# Patient Record
Sex: Female | Born: 1965 | Race: White | Hispanic: No | Marital: Married | Smoking: Never smoker
Health system: Southern US, Community
[De-identification: ages and names within clinical notes are randomized; demographics above are authoritative.]

## PROBLEM LIST (undated history)

## (undated) DIAGNOSIS — N979 Female infertility, unspecified: Secondary | ICD-10-CM

## (undated) DIAGNOSIS — F32A Depression, unspecified: Secondary | ICD-10-CM

## (undated) DIAGNOSIS — C4492 Squamous cell carcinoma of skin, unspecified: Secondary | ICD-10-CM

## (undated) DIAGNOSIS — B977 Papillomavirus as the cause of diseases classified elsewhere: Secondary | ICD-10-CM

## (undated) DIAGNOSIS — F329 Major depressive disorder, single episode, unspecified: Secondary | ICD-10-CM

## (undated) DIAGNOSIS — G43909 Migraine, unspecified, not intractable, without status migrainosus: Secondary | ICD-10-CM

## (undated) DIAGNOSIS — F418 Other specified anxiety disorders: Secondary | ICD-10-CM

## (undated) HISTORY — DX: Migraine, unspecified, not intractable, without status migrainosus: G43.909

## (undated) HISTORY — PX: AUGMENTATION MAMMAPLASTY: SUR837

## (undated) HISTORY — DX: Female infertility, unspecified: N97.9

## (undated) HISTORY — PX: FINGER SURGERY: SHX640

## (undated) HISTORY — DX: Other specified anxiety disorders: F41.8

## (undated) HISTORY — DX: Papillomavirus as the cause of diseases classified elsewhere: B97.7

## (undated) HISTORY — DX: Depression, unspecified: F32.A

## (undated) HISTORY — DX: Major depressive disorder, single episode, unspecified: F32.9

---
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
## 2002-10-05 ENCOUNTER — Other Ambulatory Visit: Admission: RE | Admit: 2002-10-05 | Discharge: 2002-10-05 | Payer: Self-pay | Admitting: Obstetrics and Gynecology

## 2002-12-30 ENCOUNTER — Other Ambulatory Visit: Admission: RE | Admit: 2002-12-30 | Discharge: 2002-12-30 | Payer: Self-pay | Admitting: Obstetrics and Gynecology

## 2003-12-23 ENCOUNTER — Other Ambulatory Visit: Admission: RE | Admit: 2003-12-23 | Discharge: 2003-12-23 | Payer: Self-pay | Admitting: Obstetrics and Gynecology

## 2004-12-25 ENCOUNTER — Other Ambulatory Visit: Admission: RE | Admit: 2004-12-25 | Discharge: 2004-12-25 | Payer: Self-pay | Admitting: Obstetrics and Gynecology

## 2005-09-04 ENCOUNTER — Encounter: Admission: RE | Admit: 2005-09-04 | Discharge: 2005-09-04 | Payer: Self-pay | Admitting: Obstetrics and Gynecology

## 2005-11-25 DIAGNOSIS — C4492 Squamous cell carcinoma of skin, unspecified: Secondary | ICD-10-CM

## 2005-11-25 HISTORY — DX: Squamous cell carcinoma of skin, unspecified: C44.92

## 2005-12-26 ENCOUNTER — Other Ambulatory Visit: Admission: RE | Admit: 2005-12-26 | Discharge: 2005-12-26 | Payer: Self-pay | Admitting: Obstetrics & Gynecology

## 2006-12-30 ENCOUNTER — Other Ambulatory Visit: Admission: RE | Admit: 2006-12-30 | Discharge: 2006-12-30 | Payer: Self-pay | Admitting: Obstetrics & Gynecology

## 2007-01-24 HISTORY — PX: BREAST ENHANCEMENT SURGERY: SHX7

## 2007-01-26 ENCOUNTER — Encounter: Admission: RE | Admit: 2007-01-26 | Discharge: 2007-01-26 | Payer: Self-pay | Admitting: Obstetrics & Gynecology

## 2008-01-18 ENCOUNTER — Other Ambulatory Visit: Admission: RE | Admit: 2008-01-18 | Discharge: 2008-01-18 | Payer: Self-pay | Admitting: Obstetrics & Gynecology

## 2008-02-24 ENCOUNTER — Encounter: Admission: RE | Admit: 2008-02-24 | Discharge: 2008-02-24 | Payer: Self-pay | Admitting: Obstetrics & Gynecology

## 2008-03-25 HISTORY — PX: BREAST ENHANCEMENT SURGERY: SHX7

## 2009-01-20 ENCOUNTER — Other Ambulatory Visit: Admission: RE | Admit: 2009-01-20 | Discharge: 2009-01-20 | Payer: Self-pay | Admitting: Obstetrics and Gynecology

## 2009-04-07 ENCOUNTER — Encounter: Admission: RE | Admit: 2009-04-07 | Discharge: 2009-04-07 | Payer: Self-pay | Admitting: Obstetrics and Gynecology

## 2010-04-13 ENCOUNTER — Encounter: Admission: RE | Admit: 2010-04-13 | Discharge: 2010-04-13 | Payer: Self-pay | Admitting: Obstetrics and Gynecology

## 2011-01-31 ENCOUNTER — Institutional Professional Consult (permissible substitution) (INDEPENDENT_AMBULATORY_CARE_PROVIDER_SITE_OTHER): Payer: BC Managed Care – PPO | Admitting: Family Medicine

## 2011-01-31 DIAGNOSIS — R142 Eructation: Secondary | ICD-10-CM

## 2011-01-31 DIAGNOSIS — R141 Gas pain: Secondary | ICD-10-CM

## 2011-01-31 DIAGNOSIS — R109 Unspecified abdominal pain: Secondary | ICD-10-CM

## 2011-03-26 ENCOUNTER — Other Ambulatory Visit: Payer: Self-pay | Admitting: Obstetrics and Gynecology

## 2011-03-26 DIAGNOSIS — Z1231 Encounter for screening mammogram for malignant neoplasm of breast: Secondary | ICD-10-CM

## 2011-04-16 ENCOUNTER — Ambulatory Visit
Admission: RE | Admit: 2011-04-16 | Discharge: 2011-04-16 | Disposition: A | Discharge status: Home / Self Care | Payer: BC Managed Care – PPO | Source: Ambulatory Visit | Attending: Obstetrics and Gynecology | Admitting: Obstetrics and Gynecology

## 2011-04-16 DIAGNOSIS — Z1231 Encounter for screening mammogram for malignant neoplasm of breast: Secondary | ICD-10-CM

## 2011-07-22 ENCOUNTER — Encounter: Payer: Self-pay | Admitting: Family Medicine

## 2012-03-17 LAB — HM PAP SMEAR: HM Pap smear: NEGATIVE

## 2012-05-04 ENCOUNTER — Other Ambulatory Visit: Payer: Self-pay | Admitting: Obstetrics and Gynecology

## 2012-05-04 DIAGNOSIS — Z1231 Encounter for screening mammogram for malignant neoplasm of breast: Secondary | ICD-10-CM

## 2012-05-05 ENCOUNTER — Ambulatory Visit
Admission: RE | Admit: 2012-05-05 | Discharge: 2012-05-05 | Disposition: A | Discharge status: Home / Self Care | Payer: BC Managed Care – PPO | Source: Ambulatory Visit | Attending: Obstetrics and Gynecology | Admitting: Obstetrics and Gynecology

## 2012-05-05 DIAGNOSIS — Z1231 Encounter for screening mammogram for malignant neoplasm of breast: Secondary | ICD-10-CM

## 2012-08-12 ENCOUNTER — Other Ambulatory Visit (INDEPENDENT_AMBULATORY_CARE_PROVIDER_SITE_OTHER): Payer: BC Managed Care – PPO

## 2012-08-12 DIAGNOSIS — Z23 Encounter for immunization: Secondary | ICD-10-CM

## 2012-11-13 ENCOUNTER — Telehealth: Payer: Self-pay | Admitting: Family Medicine

## 2012-11-13 ENCOUNTER — Other Ambulatory Visit (INDEPENDENT_AMBULATORY_CARE_PROVIDER_SITE_OTHER): Payer: BC Managed Care – PPO

## 2012-11-13 DIAGNOSIS — W5501XA Bitten by cat, initial encounter: Secondary | ICD-10-CM

## 2012-11-13 DIAGNOSIS — Z23 Encounter for immunization: Secondary | ICD-10-CM

## 2012-11-13 MED ORDER — AMOXICILLIN-POT CLAVULANATE 875-125 MG PO TABS: 1.0000 | ORAL_TABLET | Freq: Two times a day (BID) | ORAL | Status: AC

## 2012-11-13 NOTE — Telephone Encounter (Signed)
She was bitten by a foster cat.  Going out of town tomorrow.  She texted asking if I'd be available over weekend to call in rx if it got infected, and I recommended prophylactic treatment.  Discussed that with bites on hand, sometimes requires surgery if infected, NOT to wait for signs of infection, but start ABX now.  She agrees, and confirms no PCN allergy.  Side effects reviewed, use probiotics if develops diarrhea.  No tetanus shot on record in computer.  Advised pt TdaP is recommended at some point, to call for nurse visit

## 2012-11-13 NOTE — Telephone Encounter (Signed)
FYI

## 2013-03-30 ENCOUNTER — Encounter: Payer: Self-pay | Admitting: Nurse Practitioner

## 2013-03-31 ENCOUNTER — Encounter: Payer: Self-pay | Admitting: Nurse Practitioner

## 2013-03-31 ENCOUNTER — Ambulatory Visit (INDEPENDENT_AMBULATORY_CARE_PROVIDER_SITE_OTHER): Payer: BC Managed Care – PPO | Admitting: Nurse Practitioner

## 2013-03-31 VITALS — BP 96/48 | HR 62 | Resp 14 | Ht 69.0 in | Wt 116.8 lb

## 2013-03-31 DIAGNOSIS — Z0000 Encounter for general adult medical examination without abnormal findings: Secondary | ICD-10-CM

## 2013-03-31 DIAGNOSIS — Z01419 Encounter for gynecological examination (general) (routine) without abnormal findings: Secondary | ICD-10-CM

## 2013-03-31 LAB — POCT URINALYSIS DIPSTICK
Leukocytes, UA: NEGATIVE
Spec Grav, UA: 1.015
Urobilinogen, UA: NEGATIVE
pH, UA: 6.5

## 2013-03-31 LAB — COMPREHENSIVE METABOLIC PANEL
ALT: 13 U/L (ref 0–35)
AST: 17 U/L (ref 0–37)
Albumin: 4.6 g/dL (ref 3.5–5.2)
Alkaline Phosphatase: 32 U/L — ABNORMAL LOW (ref 39–117)
BUN: 14 mg/dL (ref 6–23)
CO2: 23 mEq/L (ref 19–32)
Calcium: 9.3 mg/dL (ref 8.4–10.5)
Chloride: 108 mEq/L (ref 96–112)
Creat: 0.66 mg/dL (ref 0.50–1.10)
Glucose, Bld: 87 mg/dL (ref 70–99)
Sodium: 139 mEq/L (ref 135–145)
Total Bilirubin: 0.7 mg/dL (ref 0.3–1.2)
Total Protein: 6.5 g/dL (ref 6.0–8.3)

## 2013-03-31 LAB — LIPID PANEL
Cholesterol: 139 mg/dL (ref 0–200)
HDL: 72 mg/dL (ref >39)
LDL Cholesterol: 59 mg/dL (ref 0–99)
Triglycerides: 38 mg/dL (ref <150)
VLDL: 8 mg/dL (ref 0–40)

## 2013-03-31 LAB — TSH: TSH: 1.488 u[IU]/mL (ref 0.350–4.500)

## 2013-03-31 MED ORDER — SUMATRIPTAN SUCCINATE 50 MG PO TABS: 50.0000 mg | ORAL_TABLET | ORAL | Status: DC | PRN

## 2013-03-31 NOTE — Patient Instructions (Signed)

## 2013-03-31 NOTE — Progress Notes (Signed)
47 y.o. G80P2002 Married Caucasian Fe here for annual exam.  Menses is regular and then has a migraine the week prior for a few days.  No cramps.  Heavy for only 1- 1/2 day. Super tampon changing 3 x day.  New problems with the children. Daughter having panic attacks at school.  Son having abdominal pain with ? Etiology.  Has [facility] at [facility] and now going to [facility].  He has missed a lot of school.  Patient's last menstrual period was 03/10/2013.          Sexually active: yes  The current method of family planning is vasectomy.    Exercising: yes  yoga and tennis Smoker:  no  Health Maintenance: Pap:  03/17/2012 normal with negative HR HPV MMG:  05/05/12 normal Colonoscopy:  never Td:  10/05/2002 ; TDaP given 11/13/12 Labs: Hgb- 11.9   reports that she has never smoked. She has never used smokeless tobacco. She reports that she drinks about 1.2 ounces of alcohol per week. She reports that she does not use illicit drugs.  Past Medical History  Diagnosis Date  . Migraines   . Neuroma     foot  . HPV in female   . Depression   . Situational anxiety   . Infertility, female     took clomid    Past Surgical History  Procedure Laterality Date  . Cesarean section    . Breast enhancement surgery Bilateral 01/2007    implants  . Neuroma surgery    . Breast enhancement surgery Bilateral 5/09    reconstruction/replacement breast implants  . Cesarean section  3/98    Current Outpatient Prescriptions  Medication Sig Dispense Refill  . acetaminophen (TYLENOL) 325 MG tablet Take 650 mg by mouth every 6 (six) hours as needed.        Marland Kitchen aspirin 81 MG tablet Take 81 mg by mouth daily.      . Biotin 1 MG CAPS Take by mouth daily.      . calcium carbonate (TUMS - DOSED IN MG ELEMENTAL CALCIUM) 500 MG chewable tablet Chew 1 tablet by mouth daily.        . Cholecalciferol (VITAMIN D) 1000 UNITS capsule Take 1,000 Units by mouth daily.      .  Flaxseed, Linseed, (FLAX SEED OIL PO) Take by mouth 2 (two) times daily.      Marland Kitchen lactobacillus acidophilus (BACID) TABS Take 2 tablets by mouth daily.      Marland Kitchen MAGNESIUM PO Take by mouth daily.      . Multiple Vitamins-Minerals (MULTIVITAMIN WITH MINERALS) tablet Take 1 tablet by mouth daily.        . SUMAtriptan (IMITREX) 50 MG tablet Take 50 mg by mouth every 2 (two) hours as needed.        Marland Kitchen b complex vitamins tablet Take 1 tablet by mouth daily.       No current facility-administered medications for this visit.    Family History  Problem Relation Age of Onset  . Breast cancer Maternal Grandmother 69  . Ovarian cancer Maternal Grandmother 91  . Melanoma Paternal Grandfather   . Hypertension Paternal Grandfather   . Heart attack Father     CABG  . Transient ischemic attack Father   . Migraines Mother     ROS:  Pertinent items are noted in HPI.  Otherwise, a comprehensive ROS was negative.  Exam:   BP 96/48  Pulse 62  Resp 14  Ht 5\' 9"  (1.753 m)  Wt 116 lb 12.8 oz (52.98 kg)  BMI 17.24 kg/m2  LMP 03/10/2013 Height: 5\' 9"  (175.3 cm)  Ht Readings from Last 3 Encounters:  03/31/13 5\' 9"  (1.753 m)  01/31/11 5' 8.5" (1.74 m)    General appearance: alert, cooperative and appears stated age, tearful at times Head: Normocephalic, without obvious abnormality, atraumatic Neck: no adenopathy, supple, symmetrical, trachea midline and thyroid normal to inspection and palpation Lungs: clear to auscultation bilaterally Breasts: normal appearance, no masses or tenderness Heart: regular rate and rhythm Abdomen: soft, non-tender; no masses,  no organomegaly Extremities: extremities normal, atraumatic, no cyanosis or edema Skin: Skin color, texture, turgor normal. No rashes or lesions Lymph nodes: Cervical, supraclavicular, and axillary nodes normal. No abnormal inguinal nodes palpated Neurologic: Grossly normal   Pelvic: External genitalia:  no lesions              Urethra:  normal  appearing urethra with no masses, tenderness or lesions              Bartholin's and Skene's: normal                 Vagina: normal appearing vagina with normal color and discharge, no lesions              Cervix: anteverted              Pap taken: no Bimanual Exam:  Uterus:  normal size, contour, position, consistency, mobility, non-tender              Adnexa: no mass, fullness, tenderness               Rectovaginal: Confirms               Anus:  normal sphincter tone, no lesions  A:  Well Woman with normal exam  Regular Menses and husband with vasectomy  P:   Pap smear as per guidelines   Mammogram due 04/2013  Fasting labs follow with results  counseled on adequate intake of calcium and vitamin D, diet and exercise  return annually or prn  An After Visit Summary was printed and given to the patient.

## 2013-04-01 ENCOUNTER — Telehealth: Payer: Self-pay | Admitting: *Deleted

## 2013-04-01 LAB — HEMOGLOBIN, FINGERSTICK: Hemoglobin, fingerstick: 11.9 g/dL — ABNORMAL LOW (ref 12.0–16.0)

## 2013-04-01 NOTE — Progress Notes (Signed)
Encounter reviewed by Dr. Gwen Edler Silva.  

## 2013-04-01 NOTE — Telephone Encounter (Signed)
Pt is aware of all lab results and is agreeable to Iron OTC qd and 600-800 IU of Vitamin D (OTC) qd.

## 2013-04-01 NOTE — Telephone Encounter (Signed)
Message copied by Osie Bond on Thu Apr 01, 2013  1:12 PM ------      Message from: Ria Comment R      Created: Thu Apr 01, 2013 12:37 PM       Let patient know about results, vit D per protocol ------

## 2013-04-06 ENCOUNTER — Other Ambulatory Visit: Payer: Self-pay

## 2013-04-06 DIAGNOSIS — Z1231 Encounter for screening mammogram for malignant neoplasm of breast: Secondary | ICD-10-CM

## 2013-04-08 ENCOUNTER — Telehealth: Payer: Self-pay | Admitting: Nurse Practitioner

## 2013-04-08 NOTE — Telephone Encounter (Signed)
Leavy Cella called to speak specifically with Tiffany, in regards to her lab test results. She also made it clear that she needed prior authorization for sumatriptan prescription with "[employer]." In addition, Ms.Luevano exhibited trepidation in regards to a Vitamin D deficiency that she would also like to discuss in depth. Please return her call.

## 2013-04-22 ENCOUNTER — Telehealth: Payer: Self-pay | Admitting: *Deleted

## 2013-04-22 NOTE — Telephone Encounter (Signed)
PRIOR AUTH FORM FOR [employer] TO FILL OUT FOR MEDICATION. ON YOUR DESK.

## 2013-04-26 NOTE — Telephone Encounter (Signed)
Patty, This prior authorization was given to me but is your pt.  Tresa Endo put the chart on your desk.

## 2013-04-30 ENCOUNTER — Telehealth: Payer: Self-pay | Admitting: Nurse Practitioner

## 2013-04-30 NOTE — Telephone Encounter (Signed)
Patient is having stress concerns. Please call back . Wants to discuss Lexapro. Has taken it before .Would like to see about taking something again for the stress.

## 2013-04-30 NOTE — Telephone Encounter (Signed)
Spoke with pt about family history of anxiety. Pt reports her husband and daughter both take Zoloft and it has dramatically helped them with anxiety. Pt has taken Lexapro in the past prescribed by [facility], but is now considering trying Zoloft. Wondering if [facility] would let her try it to see if it helps. Please advise.

## 2013-04-30 NOTE — Telephone Encounter (Signed)
LMTCB  aa 

## 2013-05-05 ENCOUNTER — Encounter: Payer: Self-pay | Admitting: Nurse Practitioner

## 2013-05-05 ENCOUNTER — Ambulatory Visit (INDEPENDENT_AMBULATORY_CARE_PROVIDER_SITE_OTHER): Payer: BC Managed Care – PPO | Admitting: Nurse Practitioner

## 2013-05-05 VITALS — BP 96/58 | HR 54 | Resp 12 | Ht 69.0 in | Wt 122.2 lb

## 2013-05-05 DIAGNOSIS — F329 Major depressive disorder, single episode, unspecified: Secondary | ICD-10-CM

## 2013-05-05 DIAGNOSIS — F32A Depression, unspecified: Secondary | ICD-10-CM

## 2013-05-05 MED ORDER — SUMATRIPTAN SUCCINATE 50 MG PO TABS: 50.0000 mg | ORAL_TABLET | ORAL | Status: DC | PRN

## 2013-05-05 MED ORDER — SERTRALINE HCL 50 MG PO TABS: 50.0000 mg | ORAL_TABLET | Freq: Every day | ORAL | Status: DC

## 2013-05-05 NOTE — Progress Notes (Signed)
Reviewed personally.  M. Suzanne Angles Trevizo, MD.  

## 2013-05-05 NOTE — Progress Notes (Signed)
Subjective:     Patient ID: Alyssa Reynolds, female   DOB: 1966-08-12, 47 y.o.   MRN: [id number]  HPI46 yo WM Fe presents with history of ongoing depression and anxiety about home environment. Symptoms more noted about Christmas with snappy and angry a lot. Crying spells.  Stressors has included her son with some type of abdominal pain and evaluation by multiple physicians and finally seen at [facility] without a definitive diagnosis.  He is starting a new school this fall and for the most part just wants to argue a lot with her. The daughter had anxiety attacks at school last year and nor feels anxious about anew school.Husband also with some stress in dealing with the family and she has felt like she tries to keep it together for everyone else. She denies suicidal thoughts.  In the remote past took Lexapro and felt she does well with this but would like to try Zoloft since her family has done well on this med's.She does have slight sleep problems and take OTC sleep only rare.   Review of Systems  Constitutional: Negative for activity change, appetite change and fatigue.  Respiratory: Negative.   Cardiovascular: Negative.   Gastrointestinal: Negative.   Neurological: Negative for dizziness, tremors, seizures, syncope, facial asymmetry, weakness and numbness.       History of headches  Psychiatric/Behavioral: Positive for sleep disturbance, dysphoric mood and agitation. Negative for suicidal ideas, hallucinations, confusion and self-injury. The patient is nervous/anxious. The patient is not hyperactive.        Objective:   Physical Exam  Constitutional: She is oriented to person, place, and time. She appears well-developed and well-nourished.  Neurological: She is alert and oriented to person, place, and time.  Psychiatric: Her behavior is normal. Judgment and thought content normal.  Feels sad at times. Today has good eye contact and able to express her concerns.  Denies suicidal thoughts. She is  tearful at times.       Assessment:     Situational depression and anxiety    Plan:     Rx for Zoloft 50 mg sent to the pharmacy # 90 / 0 refills. Recheck in 3 months She will call earlier if any worsening of symptoms

## 2013-05-05 NOTE — Patient Instructions (Signed)

## 2013-05-07 ENCOUNTER — Other Ambulatory Visit: Payer: Self-pay

## 2013-05-07 ENCOUNTER — Ambulatory Visit
Admission: RE | Admit: 2013-05-07 | Discharge: 2013-05-07 | Disposition: A | Discharge status: Home / Self Care | Payer: BC Managed Care – PPO | Source: Ambulatory Visit

## 2013-05-07 DIAGNOSIS — Z1231 Encounter for screening mammogram for malignant neoplasm of breast: Secondary | ICD-10-CM

## 2013-06-18 ENCOUNTER — Telehealth: Payer: Self-pay | Admitting: Nurse Practitioner

## 2013-06-18 NOTE — Telephone Encounter (Signed)
Do you want to increase her dose?

## 2013-06-18 NOTE — Telephone Encounter (Signed)
Sertraline RX: patient has been taking for about a month now but does not see a big change wants to increase dose from 50 mgs. to a higher dosage. [facility] on [address]

## 2013-06-20 NOTE — Telephone Encounter (Signed)
Yes OK to increase dose to 100 mg daily but only for 3 months then needs a consult visit.  If symptoms worsens at all tho call back.

## 2013-06-21 MED ORDER — SERTRALINE HCL 100 MG PO TABS: 100.0000 mg | ORAL_TABLET | Freq: Every day | ORAL | Status: DC

## 2013-06-21 NOTE — Telephone Encounter (Signed)
Left message on [facility] [employer] CB#VM of need to call office concerning medication. Patient Rx e-scribed to [facility] on [address]

## 2013-06-22 NOTE — Telephone Encounter (Signed)
Patient reached on her cell# of information of Rx for Sertraline was increased by Shirlyn Goltz and sent to [facility]. Patient is aware of need to follow up in 3 month with P.Grubb concerning refills.

## 2013-08-05 ENCOUNTER — Encounter: Payer: Self-pay | Admitting: Nurse Practitioner

## 2013-08-05 ENCOUNTER — Ambulatory Visit (INDEPENDENT_AMBULATORY_CARE_PROVIDER_SITE_OTHER): Payer: BC Managed Care – PPO | Admitting: Nurse Practitioner

## 2013-08-05 VITALS — BP 100/56 | HR 80 | Ht 69.0 in | Wt 119.0 lb

## 2013-08-05 DIAGNOSIS — F32A Depression, unspecified: Secondary | ICD-10-CM

## 2013-08-05 DIAGNOSIS — F329 Major depressive disorder, single episode, unspecified: Secondary | ICD-10-CM

## 2013-08-05 MED ORDER — SERTRALINE HCL 100 MG PO TABS: 100.0000 mg | ORAL_TABLET | Freq: Every day | ORAL | Status: DC

## 2013-08-05 MED ORDER — SUMATRIPTAN SUCCINATE 50 MG PO TABS: 50.0000 mg | ORAL_TABLET | ORAL | Status: DC | PRN

## 2013-08-05 NOTE — Patient Instructions (Signed)
If any changes with mood or thouhgts of suicide to call or seek help.

## 2013-08-05 NOTE — Progress Notes (Signed)
Subjective:     Patient ID: Alyssa Reynolds, female   DOB: 25-Feb-1966, 47 y.o.   MRN: [id number]  HPI  47 yo WM Fe returns to discuss her treatment with Zoloft. After she was last here she had an increase in stress to getting a change of school for her son.  Her Zoloft was increased to 100 mg and she comes in now to discuss this past month.  States she has felt much better, less tense, anxious, and no crying episodes.  She attributes a lot of improved overall mental status on the med's abut also recognizes that her children are happier in school.  They are staying out of trouble and seem to be doing better both physically and emotionally.  Daughter had some problems this summer with acting out behaviors but is now on med's for ADHD and that is really helpful.  Son still has some abdominal pain issues but is learning how to adjust better.  Review of Systems  Constitutional: Negative for fever, chills, diaphoresis, fatigue and unexpected weight change.  HENT: Negative.   Respiratory: Negative.   Cardiovascular: Negative.   Gastrointestinal: Negative.   Endocrine: Negative.   Genitourinary: Negative.   Musculoskeletal: Negative.   Skin: Negative.   Hematological: Negative.   Psychiatric/Behavioral: Negative.  Negative for suicidal ideas, behavioral problems, confusion, sleep disturbance, self-injury, dysphoric mood, decreased concentration and agitation. The patient is not nervous/anxious.        Objective:   Physical Exam  Constitutional: She is oriented to person, place, and time. She appears well-developed and well-nourished.  No physical exam is indicated at this time.  Neurological: She is alert and oriented to person, place, and time.  Psychiatric: She has a normal mood and affect. Her behavior is normal. Judgment and thought content normal.       Assessment:     Situational anxiety and depression - improved on Zoloft 100 mg    Plan:     Continue Zoloft at 100 mg daily - will  refill RX through to next AEX.  She is to call back if any symptoms worsens. Also refill is placed for her Imitrex that she uses for migraine headaches.

## 2013-08-11 NOTE — Progress Notes (Signed)
Encounter reviewed by Dr. Brook Silva.  

## 2013-08-13 ENCOUNTER — Telehealth: Payer: Self-pay | Admitting: Emergency Medicine

## 2013-08-13 NOTE — Telephone Encounter (Signed)
Called for Prior Authorization to [phone] Spoke with Aurther Loft B.  Imitrex 50 mg Tablet.  Will fax form [place] for signature [phone]

## 2013-08-17 NOTE — Telephone Encounter (Signed)
Received Prior Authorization form back to office. Per note, no prior authorization required. Patient must wait a full 30 days before refill rx. Last filled 07/16/13. Patient notified and agreeable.

## 2013-09-13 ENCOUNTER — Encounter: Payer: Self-pay | Admitting: Internal Medicine

## 2013-10-05 DIAGNOSIS — M659 Synovitis and tenosynovitis, unspecified: Secondary | ICD-10-CM | POA: Insufficient documentation

## 2013-10-05 DIAGNOSIS — M79673 Pain in unspecified foot: Secondary | ICD-10-CM | POA: Insufficient documentation

## 2013-10-05 DIAGNOSIS — M65979 Unspecified synovitis and tenosynovitis, unspecified ankle and foot: Secondary | ICD-10-CM | POA: Insufficient documentation

## 2013-10-13 ENCOUNTER — Encounter: Payer: Self-pay | Admitting: Family Medicine

## 2013-11-17 ENCOUNTER — Telehealth: Payer: Self-pay | Admitting: Emergency Medicine

## 2013-11-17 NOTE — Telephone Encounter (Signed)
Prior Authorization initiated for Imitrex 50 mg PO 1 tablet po q 2 hours prn #9 5 refills.   Will fax form.

## 2013-11-25 HISTORY — PX: FOOT SURGERY: SHX648

## 2013-12-09 NOTE — Telephone Encounter (Signed)
Spoke with patient and advised that per [employer], rx was approved for 9 tablets per Month.  She will try to obtain rx.

## 2013-12-22 ENCOUNTER — Encounter: Payer: Self-pay | Admitting: Family Medicine

## 2013-12-22 ENCOUNTER — Ambulatory Visit (INDEPENDENT_AMBULATORY_CARE_PROVIDER_SITE_OTHER): Payer: BC Managed Care – PPO | Admitting: Family Medicine

## 2013-12-22 VITALS — BP 100/70 | HR 92 | Temp 98.6°F | Ht 68.0 in | Wt 127.0 lb

## 2013-12-22 DIAGNOSIS — R52 Pain, unspecified: Secondary | ICD-10-CM

## 2013-12-22 DIAGNOSIS — J111 Influenza due to unidentified influenza virus with other respiratory manifestations: Secondary | ICD-10-CM

## 2013-12-22 DIAGNOSIS — J101 Influenza due to other identified influenza virus with other respiratory manifestations: Secondary | ICD-10-CM

## 2013-12-22 DIAGNOSIS — R509 Fever, unspecified: Secondary | ICD-10-CM

## 2013-12-22 LAB — POC INFLUENZA A&B (BINAX/QUICKVUE)
INFLUENZA A, POC: POSITIVE
INFLUENZA B, POC: NEGATIVE

## 2013-12-22 MED ORDER — OSELTAMIVIR PHOSPHATE 75 MG PO CAPS: 75.0000 mg | ORAL_CAPSULE | Freq: Two times a day (BID) | ORAL | Status: DC

## 2013-12-22 NOTE — Patient Instructions (Signed)

## 2013-12-22 NOTE — Progress Notes (Signed)
Chief Complaint  Patient presents with  . Fever    pt states she think she may have the flu, she has been runnning a fever and having chills (Flu A-positive)   Cold symptoms started 2 days ago. Yesterday she started having body aches, chills.  She had temp of 100.4 last night.  No known sick contacts. She has taken Nyquil and Dayquil with some improvement.  Cough has been mild. Ongoing headache, achiness.  Her daughter has a modeling event on Saturday that she wants to be well for.  Past Medical History  Diagnosis Date  . Migraines   . 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 Depression   . Situational anxiety   . Infertility, female     took clomid   Past Surgical History  Procedure Laterality Date  . Cesarean section    . Breast enhancement surgery Bilateral 01/2007    implants  . Neuroma surgery    . Breast enhancement surgery Bilateral 5/09    reconstruction/replacement breast implants  . Cesarean section  3/98   History   Social History  . Marital Status: Married    Spouse Name: N/A    Number of Children: N/A  . Years of Education: N/A   Occupational History  . Not on file.   Social History Main Topics  . Smoking status: Never Smoker   . Smokeless tobacco: Never Used  . Alcohol Use: 1.2 oz/week    2 Glasses of wine per week  . Drug Use: No  . Sexual Activity: Yes    Birth Control/ Protection: None     Comment: husband had vasectomy    Other Topics Concern  . Not on file   Social History Narrative  . No narrative on file   Outpatient Encounter Prescriptions as of 12/22/2013  Medication Sig  . acetaminophen (TYLENOL) 325 MG tablet Take 650 mg by mouth every 6 (six) hours as needed.    . Biotin 1 MG CAPS Take by mouth daily.  . calcium carbonate (TUMS - DOSED IN MG ELEMENTAL CALCIUM) 500 MG chewable tablet Chew 1 tablet by mouth daily.    . Cholecalciferol (VITAMIN D) 1000 UNITS capsule Take 1,000 Units by mouth daily.  Marland Kitchen  lactobacillus acidophilus (BACID) TABS Take 2 tablets by mouth daily.  . Multiple Vitamins-Minerals (MULTIVITAMIN WITH MINERALS) tablet Take 1 tablet by mouth daily.    . sertraline (ZOLOFT) 100 MG tablet Take 1 tablet (100 mg total) by mouth daily.  . betamethasone dipropionate (DIPROLENE) 0.05 % cream Apply topically as needed.  Marland Kitchen PROTOPIC 0.1 % ointment Apply topically as needed.  . SUMAtriptan (IMITREX) 50 MG tablet Take 1 tablet (50 mg total) by mouth every 2 (two) hours as needed.  . [DISCONTINUED] b complex vitamins tablet Take 1 tablet by mouth daily.  . [DISCONTINUED] Flaxseed, Linseed, (FLAXSEED OIL) 1000 MG CAPS Take 1 capsule by mouth daily.  . [DISCONTINUED] hydrOXYzine (ATARAX/VISTARIL) 25 MG tablet Take 1 tablet by mouth as needed.  . [DISCONTINUED] MAGNESIUM PO Take by mouth daily.  . [DISCONTINUED] meloxicam (MOBIC) 15 MG tablet    No Known Allergies  ROS:  Denies cough, shortness of breath, chest pain, rash, bleeding, bruising, nausea, vomiting, diarrhea, or other complaints other than those mentioned in HPI  PHYSICAL EXAM: BP 100/70  Pulse 92  Temp(Src) 98.6 F (37 C) (Oral)  Ht 5\' 8"  (1.727 m)  Wt 127 lb (57.607 kg)  BMI 19.31 kg/m2 Well  appearing female, in no distress.  Not sniffling or coughing. HEENT:  PERRL, EOMI, conjunctiva clear.  TM's and EAC's normal.  OP is clear without erythema. Nasal mucosa is without significant edema, no erythema.  Sinuses nontender Neck: mild shotty lymphadenopathy, nontender Heart: regular rate and rhythm without murmur Lungs: clear bilaterally Skin: no rash Neuro: alert and oriented.  Cranial nerves intact, normal strength, gait  ASSESSMENT/PLAN:  Fever, unspecified - Plan: POC Influenza A&B (Binax test)  Body aches - Plan: POC Influenza A&B (Binax test)  Influenza A - Plan: oseltamivir (TAMIFLU) 75 MG capsule   Risks/benefits of Tamiflu reviewed Continue supportive measures

## 2014-01-14 ENCOUNTER — Telehealth: Payer: Self-pay | Admitting: Nurse Practitioner

## 2014-01-14 NOTE — Telephone Encounter (Signed)
Patient calling with insurance coverage issues with her Imitrex RX.  She has talked with HR at employer and three way call with [employer].  For maximum insurance benefit and to make sure she has enough medication for a full month, she needs to increase dose to 100 mg.  She is requesting Paper RX to be mailed or faxed to [employer] for Imitrex 100 mg Disp 27 tablets (9 tablets per month for 3 month supply).  Patient last AEX 03-31-13.  She reports that 9 pills does not always last a month because some days she has to take two pills.  She is frustrated at constantly running out of meds or rationing pills based on severity of HA.  Advised I will have to send to provider about increased dose.    Please advise.  She also requests paper RX instead of electronic submission.

## 2014-01-14 NOTE — Telephone Encounter (Signed)
Prescription problems, patient has some info regarding Sumatriptan prescription. Patient thinks she may need an appointment, wants talk to a nurse before getting a refill.

## 2014-01-14 NOTE — Telephone Encounter (Signed)
Returning a call to Tracy °

## 2014-01-14 NOTE — Telephone Encounter (Signed)
Message left to return call to Samvel Zinn at [phone].    

## 2014-01-18 ENCOUNTER — Other Ambulatory Visit: Payer: Self-pay | Admitting: Nurse Practitioner

## 2014-01-18 MED ORDER — SUMATRIPTAN SUCCINATE 100 MG PO TABS: 100.0000 mg | ORAL_TABLET | ORAL | Status: DC | PRN

## 2014-01-18 NOTE — Telephone Encounter (Signed)
Spoke with patient and message from Milford Cage, [place] given. Advised rx sent to [employer] with fax and fax confirmation received.  Patient agreeable and will follow up prn.

## 2014-01-18 NOTE — Telephone Encounter (Signed)
I have discussed this with Dr. Sabra Heck and we ill increase her dose to 100 mg of Imitrex.  Order has been placed on Epic to reflect this change and has already been sent to [employer] for 27 tablets with refills.

## 2014-04-04 ENCOUNTER — Ambulatory Visit (INDEPENDENT_AMBULATORY_CARE_PROVIDER_SITE_OTHER): Payer: BC Managed Care – PPO | Admitting: Nurse Practitioner

## 2014-04-04 ENCOUNTER — Encounter: Payer: Self-pay | Admitting: Nurse Practitioner

## 2014-04-04 VITALS — BP 120/64 | HR 64 | Ht 68.25 in | Wt 124.0 lb

## 2014-04-04 DIAGNOSIS — G43909 Migraine, unspecified, not intractable, without status migrainosus: Secondary | ICD-10-CM

## 2014-04-04 DIAGNOSIS — Z01419 Encounter for gynecological examination (general) (routine) without abnormal findings: Secondary | ICD-10-CM

## 2014-04-04 DIAGNOSIS — Z0000 Encounter for general adult medical examination without abnormal findings: Secondary | ICD-10-CM

## 2014-04-04 LAB — POCT URINALYSIS DIPSTICK
Bilirubin, UA: NEGATIVE
GLUCOSE UA: NEGATIVE
Ketones, UA: NEGATIVE
Leukocytes, UA: NEGATIVE
NITRITE UA: NEGATIVE
PH UA: 7
PROTEIN UA: NEGATIVE
UROBILINOGEN UA: NEGATIVE

## 2014-04-04 LAB — HEMOGLOBIN, FINGERSTICK: Hemoglobin, fingerstick: 11.8 g/dL — ABNORMAL LOW (ref 12.0–16.0)

## 2014-04-04 MED ORDER — SUMATRIPTAN SUCCINATE 100 MG PO TABS: 100.0000 mg | ORAL_TABLET | ORAL | Status: DC | PRN

## 2014-04-04 NOTE — Progress Notes (Signed)
Patient ID: Alyssa Reynolds, female   DOB: 01/06/1966, 48 y.o.   MRN: [id number] 48 y.o. G2P2002 Married Caucasian Fe here for annual exam.  Started on Zoloft last summer.  She has 2 children with health issues.  Daughter is now better, but son had a recent flare and has been out of school for 3 weeks secondary to abdominal pain. Menses is regular until this month.  This cycle was 1 week late. Overall is doing well.  Patient's last menstrual period was 03/31/2014.          Sexually active: yes  The current method of family planning is vasectomy.    Exercising: yes  Home exercise routine includes occasional walking. Smoker:  no  Health Maintenance: Pap:  03/17/2012 normal with negative HR HPV MMG:  05/07/13, Bi-Rads 2: benign findings TDaP: 11/13/12 Labs:  HB:  11.8 Urine:  Large RBC (menses)   reports that she has never smoked. She has never used smokeless tobacco. She reports that she drinks about 2.4 ounces of alcohol per week. She reports that she does not use illicit drugs.  Past Medical History  Diagnosis Date  . Migraines   . 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 Depression   . Situational anxiety   . Infertility, female     took clomid    Past Surgical History  Procedure Laterality Date  . Breast enhancement surgery Bilateral 01/2007    implants  . Neuroma surgery    . Breast enhancement surgery Bilateral 5/09    reconstruction/replacement breast implants  . Cesarean section  3/98    Current Outpatient Prescriptions  Medication Sig Dispense Refill  . acetaminophen (TYLENOL) 325 MG tablet Take 650 mg by mouth every 6 (six) hours as needed.        . betamethasone dipropionate (DIPROLENE) 0.05 % cream Apply topically as needed.      . Biotin 1 MG CAPS Take by mouth daily.      . Cholecalciferol (VITAMIN D) 1000 UNITS capsule Take 1,000 Units by mouth daily.      . clobetasol cream (TEMOVATE) 0.05 % Apply topically as directed.      Marland Kitchen ibuprofen  (ADVIL,MOTRIN) 200 MG tablet Take 200 mg by mouth every 6 (six) hours as needed.      . lactobacillus acidophilus (BACID) TABS Take 2 tablets by mouth daily.      . Multiple Vitamins-Minerals (MULTIVITAMIN WITH MINERALS) tablet Take 1 tablet by mouth daily.        . sertraline (ZOLOFT) 100 MG tablet Take 1 tablet (100 mg total) by mouth daily.  90 tablet  2  . SUMAtriptan (IMITREX) 100 MG tablet Take 1 tablet (100 mg total) by mouth every 2 (two) hours as needed for migraine or headache. May repeat in 2 hours if headache persists or recurs.  27 tablet  3  . calcium carbonate (TUMS - DOSED IN MG ELEMENTAL CALCIUM) 500 MG chewable tablet Chew 1 tablet by mouth daily.        Marland Kitchen PROTOPIC 0.1 % ointment Apply topically as needed.       No current facility-administered medications for this visit.    Family History  Problem Relation Age of Onset  . Breast cancer Maternal Grandmother 39  . Ovarian cancer Maternal Grandmother 91  . Melanoma Paternal Grandfather   . Hypertension Paternal Grandfather   . Heart attack Father     CABG  . Transient ischemic attack  Father   . Melanoma Father   . Osteoarthritis Mother   . Melanoma Maternal Aunt   . Hypertension Maternal Grandfather   . Melanoma Maternal Grandfather   . Stroke Paternal Grandmother     ROS:  Pertinent items are noted in HPI.  Otherwise, a comprehensive ROS was negative.  Exam:   BP 120/64  Pulse 64  Ht 5' 8.25" (1.734 m)  Wt 124 lb (56.246 kg)  BMI 18.71 kg/m2  LMP 03/31/2014 Height: 5' 8.25" (173.4 cm)  Ht Readings from Last 3 Encounters:  04/04/14 5' 8.25" (1.734 m)  12/22/13 5\' 8"  (1.727 m)  08/05/13 5\' 9"  (1.753 m)    General appearance: alert, cooperative and appears stated age Head: Normocephalic, without obvious abnormality, atraumatic Neck: no adenopathy, supple, symmetrical, trachea midline and thyroid normal to inspection and palpation Lungs: clear to auscultation bilaterally Breasts: normal appearance, no  masses or tenderness Heart: regular rate and rhythm Abdomen: soft, non-tender; no masses,  no organomegaly Extremities: extremities normal, atraumatic, no cyanosis or edema Skin: Skin color, texture, turgor normal. No rashes or lesions Lymph nodes: Cervical, supraclavicular, and axillary nodes normal. No abnormal inguinal nodes palpated Neurologic: Grossly normal   Pelvic: External genitalia:  no lesions              Urethra:  normal appearing urethra with no masses, tenderness or lesions              Bartholin's and Skene's: normal                 Vagina: normal appearing vagina with normal color and discharge, no lesions              Cervix: anteverted              Pap taken: no Bimanual Exam:  Uterus:  normal size, contour, position, consistency, mobility, non-tender              Adnexa: no mass, fullness, tenderness               Rectovaginal: Confirms               Anus:  normal sphincter tone, no lesions  A:  Well Woman with normal exam  Perimenopausal  Situational anxiety and depression    P:   Reviewed health and wellness pertinent to exam  Pap smear not taken today  Mammogram due 04/2014  Refill on Imitrex for a year  Counseled on breast self exam, mammography screening, adequate intake of calcium and vitamin D, diet and exercise return annually or prn  An After Visit Summary was printed and given to the patient.

## 2014-04-04 NOTE — Patient Instructions (Addendum)

## 2014-04-05 LAB — COMPREHENSIVE METABOLIC PANEL
ALK PHOS: 37 U/L — AB (ref 39–117)
ALT: 12 U/L (ref 0–35)
AST: 17 U/L (ref 0–37)
Albumin: 4.3 g/dL (ref 3.5–5.2)
BILIRUBIN TOTAL: 0.4 mg/dL (ref 0.2–1.2)
BUN: 12 mg/dL (ref 6–23)
CO2: 28 mEq/L (ref 19–32)
Calcium: 9.1 mg/dL (ref 8.4–10.5)
Chloride: 105 mEq/L (ref 96–112)
Creat: 0.66 mg/dL (ref 0.50–1.10)
Glucose, Bld: 86 mg/dL (ref 70–99)
Potassium: 4.7 mEq/L (ref 3.5–5.3)
Sodium: 138 mEq/L (ref 135–145)
Total Protein: 6.4 g/dL (ref 6.0–8.3)

## 2014-04-05 LAB — TSH: TSH: 1.527 u[IU]/mL (ref 0.350–4.500)

## 2014-04-05 LAB — LIPID PANEL
Cholesterol: 161 mg/dL (ref 0–200)
HDL: 71 mg/dL
LDL Cholesterol: 81 mg/dL (ref 0–99)
Total CHOL/HDL Ratio: 2.3 ratio
Triglycerides: 47 mg/dL
VLDL: 9 mg/dL (ref 0–40)

## 2014-04-05 LAB — VITAMIN D 25 HYDROXY (VIT D DEFICIENCY, FRACTURES): Vit D, 25-Hydroxy: 45 ng/mL (ref 30–89)

## 2014-04-06 NOTE — Progress Notes (Signed)
Encounter reviewed by Dr. Brook Silva.  

## 2014-04-26 ENCOUNTER — Other Ambulatory Visit: Payer: Self-pay

## 2014-04-26 DIAGNOSIS — Z1231 Encounter for screening mammogram for malignant neoplasm of breast: Secondary | ICD-10-CM

## 2014-05-19 ENCOUNTER — Ambulatory Visit
Admission: RE | Admit: 2014-05-19 | Discharge: 2014-05-19 | Disposition: A | Discharge status: Home / Self Care | Payer: BC Managed Care – PPO | Source: Ambulatory Visit

## 2014-05-19 DIAGNOSIS — Z1231 Encounter for screening mammogram for malignant neoplasm of breast: Secondary | ICD-10-CM

## 2014-06-09 ENCOUNTER — Other Ambulatory Visit: Payer: Self-pay | Admitting: Nurse Practitioner

## 2014-06-09 MED ORDER — SERTRALINE HCL 100 MG PO TABS: 100.0000 mg | ORAL_TABLET | Freq: Every day | ORAL | Status: DC

## 2014-06-09 NOTE — Telephone Encounter (Signed)
Patient needs refill for  sertraline (ZOLOFT) 100 MG tablet  Take 1 tablet (100 mg total) by mouth daily., Starting 08/05/2013, Until Discontinued, Normal, Last Dose: Not Recorded    [facility] [phone]

## 2014-06-09 NOTE — Telephone Encounter (Signed)
Last AEX 04/04/14 Last refill 08/05/13 #90/2 refills Next appt 04/10/15  Send to Ms Jackelyn Poling. Ms Chong Sicilian out of the office until Monday.  Please approve or deny Rx.

## 2014-09-13 ENCOUNTER — Other Ambulatory Visit: Payer: Self-pay | Admitting: Nurse Practitioner

## 2014-09-13 MED ORDER — SERTRALINE HCL 100 MG PO TABS: 100.0000 mg | ORAL_TABLET | Freq: Every day | ORAL | Status: DC

## 2014-09-13 NOTE — Telephone Encounter (Signed)
Last refilled: 06/09/14 #90/0 refills by Ms. Debbie Last AEX:  04/04/14 with Ms. Patty AEX Scheduled: 04/10/15 with Ms. Patty  Please Advise.

## 2014-09-13 NOTE — Telephone Encounter (Signed)
Patient calling for refills on:  sertraline (ZOLOFT) 100 MG tablet  Take 1 tablet (100 mg total) by mouth daily., Starting 06/09/2014, Until Discontinued, Normal, Last Dose: Not Recorded  Refills: 0 ordered Pharmacy: [facility] ([facility]) [place], [place]

## 2014-09-14 NOTE — Telephone Encounter (Signed)
Patient notified of refills being sent.

## 2014-09-26 ENCOUNTER — Encounter: Payer: Self-pay | Admitting: Nurse Practitioner

## 2014-09-27 ENCOUNTER — Encounter: Payer: Self-pay | Admitting: Internal Medicine

## 2014-10-03 ENCOUNTER — Ambulatory Visit (INDEPENDENT_AMBULATORY_CARE_PROVIDER_SITE_OTHER): Payer: BC Managed Care – PPO | Admitting: Podiatry

## 2014-10-03 ENCOUNTER — Ambulatory Visit (INDEPENDENT_AMBULATORY_CARE_PROVIDER_SITE_OTHER): Payer: BC Managed Care – PPO

## 2014-10-03 ENCOUNTER — Encounter: Payer: Self-pay | Admitting: Podiatry

## 2014-10-03 VITALS — BP 109/64 | HR 64 | Resp 17

## 2014-10-03 DIAGNOSIS — M779 Enthesopathy, unspecified: Secondary | ICD-10-CM

## 2014-10-03 DIAGNOSIS — M79675 Pain in left toe(s): Secondary | ICD-10-CM

## 2014-10-03 MED ORDER — TRIAMCINOLONE ACETONIDE 10 MG/ML IJ SUSP
10.0000 mg | Freq: Once | INTRAMUSCULAR | Status: AC
  Administered 2014-10-03: 10 mg

## 2014-10-04 NOTE — Progress Notes (Signed)
Subjective:     Patient ID: Alyssa Reynolds, female   DOB: 09-Jun-1966, 48 y.o.   MRN: [id number]  HPIpatient presents with inflammation and pain second metatarsophalangeal joint of the right foot that's been present for a long time and has failed to respond to conservative care that she has attempted and has had previous steroid injection which was not successful   Review of Systems  All other systems reviewed and are negative.      Objective:   Physical Exam  Constitutional: She is oriented to person, place, and time.  Cardiovascular: Intact distal pulses.   Musculoskeletal: Normal range of motion.  Neurological: She is oriented to person, place, and time.  Skin: Skin is warm.  Nursing note and vitals reviewed. neurovascular status intact with muscle strength adequate and range of motion of the subtalar midtarsal joint within normal limits. Patient's noted to have inflammation second metatarsophalangeal joint right that's very painful with thickness of the joint surface noted but no indication of digital deformity. 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 Today I did a proximal nerve block aspirated the joint giving out a small amount of clear fluid and injected with a quarter cc of dexamethasone Kenalog and applied thick plantar pad. May consider orthotics graphite insoles or possible long-term shortening osteotomy of the second metatarsal bone. Reappoint one week

## 2014-10-10 ENCOUNTER — Ambulatory Visit (INDEPENDENT_AMBULATORY_CARE_PROVIDER_SITE_OTHER): Payer: BC Managed Care – PPO | Admitting: Podiatry

## 2014-10-10 ENCOUNTER — Encounter: Payer: Self-pay | Admitting: Podiatry

## 2014-10-10 VITALS — BP 98/61 | HR 83 | Resp 16

## 2014-10-10 DIAGNOSIS — M779 Enthesopathy, unspecified: Secondary | ICD-10-CM

## 2014-10-11 NOTE — Progress Notes (Signed)
Subjective:     Patient ID: Alyssa Reynolds, female   DOB: 04/03/1966, 48 y.o.   MRN: [id number]  HPIpatient states that my right foot is feeling a lot better and this is the best it's been for several years   Review of Systems     Objective:   Physical Exam Neurovascular status intact with significant diminishment of discomfort right second metatarsophalangeal joint with inflammation noted of a mild degree but much improved over previous visit    Assessment:     Improved chronic capsulitis of the second metatarsophalangeal joint right with elongated metatarsal as part of the issue    Plan:     Explained condition and at this time scanned for custom orthotics to reduce stress against the joint surface. Discussed that ultimately this may require shortening osteotomy the second metatarsal and that also we will utilize possible graphite plating

## 2014-11-04 ENCOUNTER — Ambulatory Visit: Payer: BC Managed Care – PPO | Admitting: Podiatry

## 2014-11-04 DIAGNOSIS — M779 Enthesopathy, unspecified: Secondary | ICD-10-CM

## 2014-11-04 NOTE — Patient Instructions (Signed)

## 2014-12-07 ENCOUNTER — Ambulatory Visit (INDEPENDENT_AMBULATORY_CARE_PROVIDER_SITE_OTHER): Payer: BLUE CROSS/BLUE SHIELD | Admitting: Podiatry

## 2014-12-07 ENCOUNTER — Encounter: Payer: Self-pay | Admitting: Podiatry

## 2014-12-07 VITALS — BP 95/56 | HR 73 | Resp 16

## 2014-12-07 DIAGNOSIS — M779 Enthesopathy, unspecified: Secondary | ICD-10-CM

## 2014-12-07 DIAGNOSIS — M79675 Pain in left toe(s): Secondary | ICD-10-CM

## 2014-12-07 DIAGNOSIS — M79674 Pain in right toe(s): Secondary | ICD-10-CM

## 2014-12-07 NOTE — Patient Instructions (Signed)
Pre-Operative Instructions  Congratulations, you have decided to take an important step to improving your quality of life.  You can be assured that the doctors of [facility] will be with you every step of the way.  1. Plan to be at the surgery center/hospital at least 1 (one) hour prior to your scheduled time unless otherwise directed by the surgical center/hospital staff.  You must have a responsible adult accompany you, remain during the surgery and drive you home.  Make sure you have directions to the surgical center/hospital and know how to get there on time. 2. For hospital based surgery you will need to obtain a history and physical form from your family physician within 1 month prior to the date of surgery- we will give you a form for you primary physician.  3. We make every effort to accommodate the date you request for surgery.  There are however, times where surgery dates or times have to be moved.  We will contact you as soon as possible if a change in schedule is required.   4. No Aspirin/Ibuprofen for one week before surgery.  If you are on aspirin, any non-steroidal anti-inflammatory medications (Mobic, Aleve, Ibuprofen) you should stop taking it 7 days prior to your surgery.  You make take Tylenol  For pain prior to surgery.  5. Medications- If you are taking daily heart and blood pressure medications, seizure, reflux, allergy, asthma, anxiety, pain or diabetes medications, make sure the surgery center/hospital is aware before the day of surgery so they may notify you which medications to take or avoid the day of surgery. 6. No food or drink after midnight the night before surgery unless directed otherwise by surgical center/hospital staff. 7. No alcoholic beverages 24 hours prior to surgery.  No smoking 24 hours prior to or 24 hours after surgery. 8. Wear loose pants or shorts- loose enough to fit over bandages, boots, and casts. 9. No slip on shoes, sneakers are best. 10. Bring  your boot with you to the surgery center/hospital.  Also bring crutches or a walker if your physician has prescribed it for you.  If you do not have this equipment, it will be provided for you after surgery. 11. If you have not been contracted by the surgery center/hospital by the day before your surgery, call to confirm the date and time of your surgery. 12. Leave-time from work may vary depending on the type of surgery you have.  Appropriate arrangements should be made prior to surgery with your employer. 13. Prescriptions will be provided immediately following surgery by your doctor.  Have these filled as soon as possible after surgery and take the medication as directed. 14. Remove nail polish on the operative foot. 15. Wash the night before surgery.  The night before surgery wash the foot and leg well with the antibacterial soap provided and water paying special attention to beneath the toenails and in between the toes.  Rinse thoroughly with water and dry well with a towel.  Perform this wash unless told not to do so by your physician.  Enclosed: 1 Ice pack (please put in freezer the night before surgery)   1 Hibiclens skin cleaner   Pre-op Instructions  If you have any questions regarding the instructions, do not hesitate to call our office.  [place]: [address] [place], [place] [address] [phone]  [place]: [address] [place], [place] [address] [phone]  [place]: [address]  , [place] [address] [phone]  Dr. Richard   Tuchman DPM, Dr. Norman Regal DPM Dr. Richard Sikora DPM, Dr. M. Todd Hyatt DPM, Dr. Kathryn Egerton DPM 

## 2014-12-07 NOTE — Progress Notes (Signed)
Subjective:     Patient ID: Alyssa Reynolds, female   DOB: 02/16/1966, 49 y.o.   MRN: [id number]  HPI patient presents stating the pain is back and it bothers me anytime I try to be active or play tennis or anything along those lines   Review of Systems     Objective:   Physical Exam Neurovascular status intact digits well perfused with discomfort second metatarsophalangeal joint right with elongated second toe and fluid buildup in the joint itself    Assessment:     Chronic capsulitis of several year duration second MPJ right with fluid buildup and elongated second toe which is part of the pathological process    Plan:     Condition explained to patient and reviewed treatment options. Has not responded to injection treatment and orthotics and at this point I have recommended shortening osteotomy along with shortening digital procedure second toe with internal pin fixation. Patient wants surgery understanding risk and at this time I allowed her to read a consent form understanding the procedures and alternative treatments and we reviewed total recovery. Of approximate 6 months. Patient wants surgery signs consent form and is dispensed cam walker with instructions on usage and will tentatively scheduled for the next several weeks for procedure. She will call if she has any questions prior to surgery

## 2014-12-13 ENCOUNTER — Telehealth: Payer: Self-pay | Admitting: *Deleted

## 2014-12-13 NOTE — Telephone Encounter (Signed)
I returned her call.  "I'm calling to schedule my surgery for Tuesday of next week."  He doesn't have anything available on Tuesday of next week.  "He told me I needed to schedule as soon as possible.  I called on Friday, I guess you all may have left early.  No one ever called me back."  He didn't have any available time last week for Tuesday.  "Why would he tell me to schedule as soon as possible?  Does he have anything available on 12/27/2014?"  Yes, that date is available.  "Well go ahead and schedule me then.  What time will I need to be there?"  You will receive a call from the surgical center a day or 2 prior to surgery.  They will let you know what time to be there.  "Okay, thanks."

## 2014-12-13 NOTE — Telephone Encounter (Signed)
Pt states she would like to schedule surgery with Dr. Paulla Dolly.

## 2014-12-20 ENCOUNTER — Telehealth: Payer: Self-pay | Admitting: *Deleted

## 2014-12-20 NOTE — Telephone Encounter (Signed)
Pt states she is scheduled for a yearly physical exam 3 days after her 12/27/2014 surgery,  Will that be okay?  I left a message informing pt of the post-op rules of 5 minutes/hours for the 1st week and increasing by 5 minutes /hour each post-op week and she will also be on narcotic pain medication to some degree, so may be best to reschedule to a later date unless she is having a problem and to call our office with concerns.

## 2014-12-26 HISTORY — PX: NEUROMA SURGERY: SHX722

## 2014-12-27 ENCOUNTER — Encounter: Payer: Self-pay | Admitting: Podiatry

## 2014-12-27 DIAGNOSIS — M21549 Acquired clubfoot, unspecified foot: Secondary | ICD-10-CM

## 2014-12-27 DIAGNOSIS — M2041 Other hammer toe(s) (acquired), right foot: Secondary | ICD-10-CM

## 2015-01-02 ENCOUNTER — Ambulatory Visit: Payer: Self-pay

## 2015-01-02 ENCOUNTER — Encounter: Payer: BLUE CROSS/BLUE SHIELD | Admitting: Podiatry

## 2015-01-02 ENCOUNTER — Ambulatory Visit (INDEPENDENT_AMBULATORY_CARE_PROVIDER_SITE_OTHER): Payer: BLUE CROSS/BLUE SHIELD | Admitting: Podiatry

## 2015-01-02 DIAGNOSIS — M779 Enthesopathy, unspecified: Secondary | ICD-10-CM

## 2015-01-02 DIAGNOSIS — Z9889 Other specified postprocedural states: Secondary | ICD-10-CM

## 2015-01-02 DIAGNOSIS — M79675 Pain in left toe(s): Secondary | ICD-10-CM

## 2015-01-02 NOTE — Progress Notes (Signed)
POV 1 DOS 12/27/14 HAMMERTOE REPAIR WITH SHORTENING  I AM ACTUALLY DOING REALLY WELL

## 2015-01-04 NOTE — Progress Notes (Signed)
Subjective:     Patient ID: Alyssa Reynolds, female   DOB: 11-08-1966, 49 y.o.   MRN: [id number]  HPI patient states that she's doing real well with her right foot and she's having minimal discomfort or swelling noted and is able to walk without pain   Review of Systems     Objective:   Physical Exam Neurovascular status intact with muscle strength adequate and range of motion subtalar midtarsal joint within normal limits. Patient's second toes in good alignment with pin in place and second metatarsal appears to be doing well with diminished inflammation in the second metatarsal phalangeal joint. Patient's incision is well coapted with no drainage    Assessment:     Doing well post osteotomy second metatarsal right and fusion second toe    Plan:     H&P reviewed and sterile dressing reapplied and brace was dispensed to provide for compression ankle stabilization and lowering of the second toe which was explained to the patient. Reappoint 2 weeks for reevaluation or earlier if any issues should occur and continue immobilization

## 2015-01-09 NOTE — Progress Notes (Signed)
DOS Right 2nd metatarsal osteotomy, Right 2nd hammer toe repair with pin.

## 2015-01-10 ENCOUNTER — Telehealth: Payer: Self-pay | Admitting: *Deleted

## 2015-01-10 NOTE — Telephone Encounter (Addendum)
Pt asked if the braces to hold her toe down were in. Left message to stop by the office to receive the brace and instructions.  Pt presented for instruction of Bioskin hammer toe brace.  I applied the brace to the right 2nd toe and instructed the pt to keep the toe in good position by making certain all toes lay in a flat plane, and to wear it a night if it was comfortable.  Pt agreed.

## 2015-01-31 ENCOUNTER — Ambulatory Visit (INDEPENDENT_AMBULATORY_CARE_PROVIDER_SITE_OTHER): Payer: BLUE CROSS/BLUE SHIELD | Admitting: Podiatry

## 2015-01-31 ENCOUNTER — Ambulatory Visit (INDEPENDENT_AMBULATORY_CARE_PROVIDER_SITE_OTHER): Payer: BLUE CROSS/BLUE SHIELD

## 2015-01-31 ENCOUNTER — Encounter: Payer: Self-pay | Admitting: Podiatry

## 2015-01-31 VITALS — BP 106/61 | HR 89 | Resp 18

## 2015-01-31 DIAGNOSIS — R52 Pain, unspecified: Secondary | ICD-10-CM | POA: Diagnosis not present

## 2015-01-31 DIAGNOSIS — M79675 Pain in left toe(s): Secondary | ICD-10-CM

## 2015-01-31 DIAGNOSIS — M779 Enthesopathy, unspecified: Secondary | ICD-10-CM

## 2015-01-31 NOTE — Progress Notes (Signed)
Subjective:     Patient ID: Alyssa Reynolds, female   DOB: 1966/01/04, 49 y.o.   MRN: [id number]  HPI patient points to right second toe stating it was discolored and sore and she also would like to get her pin out if possible   Review of Systems     Objective:   Physical Exam Neurovascular status intact with well aligned second toe right with pin in place with some irritation of the proximal portion of the second toe but no drainage of the incision with wound edges well coapted and no proximal edema erythema noted    Assessment:     Probable overutilization of digital splinting creating irritation of the toe    Plan:     Pin removed and sterile dressing applied. I advised this patient on soaks and stretching exercises and gradual return soft shoe and will be seen back in 4 weeks and let us any issues should occur

## 2015-02-06 ENCOUNTER — Ambulatory Visit: Payer: BLUE CROSS/BLUE SHIELD | Admitting: Podiatry

## 2015-02-28 ENCOUNTER — Ambulatory Visit (INDEPENDENT_AMBULATORY_CARE_PROVIDER_SITE_OTHER): Payer: BLUE CROSS/BLUE SHIELD

## 2015-02-28 ENCOUNTER — Encounter: Payer: Self-pay | Admitting: Podiatry

## 2015-02-28 ENCOUNTER — Ambulatory Visit (INDEPENDENT_AMBULATORY_CARE_PROVIDER_SITE_OTHER): Payer: BLUE CROSS/BLUE SHIELD | Admitting: Podiatry

## 2015-02-28 VITALS — BP 97/63 | HR 86 | Resp 10

## 2015-02-28 DIAGNOSIS — Z9889 Other specified postprocedural states: Secondary | ICD-10-CM

## 2015-02-28 DIAGNOSIS — M79675 Pain in left toe(s): Secondary | ICD-10-CM

## 2015-03-01 NOTE — Progress Notes (Signed)
Subjective:     Patient ID: Alyssa Reynolds, female   DOB: [birth year], 49 y.o.   MRN: [id number]  HPI patient states I'm doing well with the toe being stiff but I'm not getting the same pain I used to have   Review of Systems     Objective:   Physical Exam Neurovascular status intact with diminishment of discomfort of the right second toe with wound edges well coapted well and alignment of the toe good    Assessment:     Doing well post digital surgery and osteotomy surgery right    Plan:     Reviewed condition and recommended change and reduced activity but gradual increase in simple type activities. X-rays look great and patient will be seen back in 6 weeks and explained its normal have some swelling and stiffness at this time

## 2015-04-10 ENCOUNTER — Ambulatory Visit (INDEPENDENT_AMBULATORY_CARE_PROVIDER_SITE_OTHER): Payer: BLUE CROSS/BLUE SHIELD | Admitting: Podiatry

## 2015-04-10 ENCOUNTER — Ambulatory Visit (INDEPENDENT_AMBULATORY_CARE_PROVIDER_SITE_OTHER): Payer: BLUE CROSS/BLUE SHIELD | Admitting: Nurse Practitioner

## 2015-04-10 ENCOUNTER — Encounter: Payer: Self-pay | Admitting: Nurse Practitioner

## 2015-04-10 ENCOUNTER — Encounter: Payer: Self-pay | Admitting: Podiatry

## 2015-04-10 ENCOUNTER — Ambulatory Visit (INDEPENDENT_AMBULATORY_CARE_PROVIDER_SITE_OTHER): Payer: BLUE CROSS/BLUE SHIELD

## 2015-04-10 VITALS — BP 92/60 | HR 72 | Resp 16 | Ht 68.75 in | Wt 125.0 lb

## 2015-04-10 VITALS — BP 117/73 | HR 74 | Resp 10

## 2015-04-10 DIAGNOSIS — Z9889 Other specified postprocedural states: Secondary | ICD-10-CM

## 2015-04-10 DIAGNOSIS — Z0000 Encounter for general adult medical examination without abnormal findings: Secondary | ICD-10-CM

## 2015-04-10 LAB — LIPID PANEL
CHOLESTEROL: 165 mg/dL (ref 0–200)
HDL: 68 mg/dL (ref >=46)
LDL Cholesterol: 66 mg/dL (ref 0–99)
TRIGLYCERIDES: 156 mg/dL — AB (ref <150)
Total CHOL/HDL Ratio: 2.4 Ratio
VLDL: 31 mg/dL (ref 0–40)

## 2015-04-10 LAB — COMPREHENSIVE METABOLIC PANEL
ALT: 22 U/L (ref 0–35)
AST: 19 U/L (ref 0–37)
Albumin: 4.3 g/dL (ref 3.5–5.2)
Alkaline Phosphatase: 44 U/L (ref 39–117)
BUN: 12 mg/dL (ref 6–23)
CHLORIDE: 103 meq/L (ref 96–112)
CO2: 27 meq/L (ref 19–32)
Calcium: 9.4 mg/dL (ref 8.4–10.5)
Creat: 0.61 mg/dL (ref 0.50–1.10)
Glucose, Bld: 76 mg/dL (ref 70–99)
Potassium: 4.8 mEq/L (ref 3.5–5.3)
SODIUM: 141 meq/L (ref 135–145)
TOTAL PROTEIN: 6.7 g/dL (ref 6.0–8.3)
Total Bilirubin: 0.6 mg/dL (ref 0.2–1.2)

## 2015-04-10 LAB — POCT URINALYSIS DIPSTICK
Bilirubin, UA: NEGATIVE
Blood, UA: NEGATIVE
GLUCOSE UA: NEGATIVE
Ketones, UA: NEGATIVE
Leukocytes, UA: NEGATIVE
Nitrite, UA: NEGATIVE
Protein, UA: NEGATIVE
UROBILINOGEN UA: NEGATIVE
pH, UA: 8

## 2015-04-10 LAB — HEMOGLOBIN, FINGERSTICK: Hemoglobin, fingerstick: 12.2 g/dL (ref 12.0–16.0)

## 2015-04-10 LAB — TSH: TSH: 1.079 u[IU]/mL (ref 0.350–4.500)

## 2015-04-10 NOTE — Progress Notes (Signed)
Patient ID: Alyssa Reynolds, female   DOB: 06-02-1966, 49 y.o.   MRN: [id number] Pt. came in today for AEX and wanted labs.  She was unable to stay for her exam secondary to time schedule. She is rescheduled and will go ahead and do labs only.

## 2015-04-10 NOTE — Progress Notes (Deleted)
49 y.o. G61P2002 Married  Caucasian Fe here for annual exam.    Patient's last menstrual period was 03/04/2015.          Sexually active: Yes.    The current method of family planning is vasectomy.    Exercising: Yes.    Walking  Smoker:  no  Health Maintenance: Pap: 03/17/12 Neg. HR HPV:Neg MMG:  05/19/14 BIRADS1:Neg Self Breast Exam: yes Colonoscopy:  Never BMD:   Never TDaP:  2013 Labs: Here today UA: Clear Hg: 12.2   reports that she has never smoked. She has never used smokeless tobacco. She reports that she drinks about 2.4 oz of alcohol per week. She reports that she does not use illicit drugs.  Past Medical History  Diagnosis Date  . Migraines   . 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 Depression   . Situational anxiety   . Infertility, female     took clomid    Past Surgical History  Procedure Laterality Date  . Breast enhancement surgery Bilateral 01/2007    implants  . Neuroma surgery    . Breast enhancement surgery Bilateral 5/09    reconstruction/replacement breast implants  . Cesarean section  3/98  . Foot surgery Right 12/2014    Current Outpatient Prescriptions  Medication Sig Dispense Refill  . acetaminophen (TYLENOL) 325 MG tablet Take 650 mg by mouth every 6 (six) hours as needed.     . Biotin 1 MG CAPS Take by mouth daily.    . Cholecalciferol (VITAMIN D) 1000 UNITS capsule Take 1,000 Units by mouth daily.    Marland Kitchen ibuprofen (ADVIL,MOTRIN) 200 MG tablet Take 200 mg by mouth every 6 (six) hours as needed.    . lactobacillus acidophilus (BACID) TABS Take 2 tablets by mouth daily.    . Multiple Vitamins-Minerals (MULTIVITAMIN WITH MINERALS) tablet Take 1 tablet by mouth daily.      . sertraline (ZOLOFT) 100 MG tablet Take 1 tablet (100 mg total) by mouth daily. 90 tablet 2  . SUMAtriptan (IMITREX) 100 MG tablet Take 1 tablet (100 mg total) by mouth every 2 (two) hours as needed for migraine or headache. May repeat in 2 hours if  headache persists or recurs. 27 tablet 3   No current facility-administered medications for this visit.    Family History  Problem Relation Age of Onset  . Breast cancer Maternal Grandmother 29  . Ovarian cancer Maternal Grandmother 91  . Melanoma Paternal Grandfather   . Hypertension Paternal Grandfather   . Heart attack Father     CABG  . Transient ischemic attack Father   . Melanoma Father   . Osteoarthritis Mother   . Melanoma Maternal Aunt   . Hypertension Maternal Grandfather   . Melanoma Maternal Grandfather   . Stroke Paternal Grandmother     ROS:  Pertinent items are noted in HPI.  Otherwise, a comprehensive ROS was negative.  Exam:   BP 92/60 mmHg  Pulse 72  Resp 16  Ht 5' 8.75" (1.746 m)  Wt 125 lb (56.7 kg)  BMI 18.60 kg/m2  LMP 03/04/2015 Height: 5' 8.75" (174.6 cm) Ht Readings from Last 3 Encounters:  04/10/15 5' 8.75" (1.746 m)  04/04/14 5' 8.25" (1.734 m)  12/22/13 5\' 8"  (1.727 m)    General appearance: alert, cooperative and appears stated age Head: Normocephalic, without obvious abnormality, atraumatic Neck: no adenopathy, supple, symmetrical, trachea midline and thyroid {EXAM; THYROID:18604} Lungs: clear to auscultation bilaterally  Breasts: {Exam; breast:13139::"normal appearance, no masses or tenderness"} Heart: regular rate and rhythm Abdomen: soft, non-tender; no masses,  no organomegaly Extremities: extremities normal, atraumatic, no cyanosis or edema Skin: Skin color, texture, turgor normal. No rashes or lesions Lymph nodes: Cervical, supraclavicular, and axillary nodes normal. No abnormal inguinal nodes palpated Neurologic: Grossly normal   Pelvic: External genitalia:  no lesions              Urethra:  normal appearing urethra with no masses, tenderness or lesions              Bartholin's and Skene's: normal                 Vagina: normal appearing vagina with normal color and discharge, no lesions              Cervix: {exam;  cervix:14595}              Pap taken: {yes no:314532} Bimanual Exam:  Uterus:  {exam; uterus:12215}              Adnexa: {exam; adnexa:12223}               Rectovaginal: Confirms               Anus:  normal sphincter tone, no lesions  Chaperone present: Yes/NO  A:  Well Woman with normal exam  P:   Reviewed health and wellness pertinent to exam  Pap smear taken today  {plan; gyn:5269::"mammogram","pap smear","return annually or prn"}  An After Visit Summary was printed and given to the patient.

## 2015-04-10 NOTE — Progress Notes (Signed)
   Subjective:    Patient ID: Alyssa Reynolds, female    DOB: [birth year], 49 y.o.   MRN: [id number]  HPI Comments: DOS 12/27/2014 right 2nd hammer toe repair with pin.       Review of Systems     Objective:   Physical Exam        Assessment & Plan:

## 2015-04-11 LAB — VITAMIN D 25 HYDROXY (VIT D DEFICIENCY, FRACTURES): Vit D, 25-Hydroxy: 24 ng/mL — ABNORMAL LOW (ref 30–100)

## 2015-04-11 NOTE — Progress Notes (Signed)
Subjective:     Patient ID: Alyssa Reynolds, female   DOB: 1966/05/04, 49 y.o.   MRN: [id number]  HPI patient presents with well-healing surgical site right but states that she banged her toe again several weeks ago and it's been irritating her   Review of Systems     Objective:   Physical Exam Neurological vascular status intact muscle strength adequate and well-healing surgical site second digit second metatarsal with very mild elevation of the second digit but acceptable with minimal discomfort when I pressed the metatarsal phalangeal joint    Assessment:     Doing well overall with continued trauma to her foot but I believe a good long-term process    Plan:     H&P and x-rays reviewed with patient. At this point I recommended continuing to plantarflexed the toe slowly increase activity and reappoint for [place] to recheck again in 6 weeks

## 2015-04-14 ENCOUNTER — Encounter: Payer: Self-pay | Admitting: Nurse Practitioner

## 2015-05-15 ENCOUNTER — Ambulatory Visit (INDEPENDENT_AMBULATORY_CARE_PROVIDER_SITE_OTHER): Payer: BLUE CROSS/BLUE SHIELD | Admitting: Nurse Practitioner

## 2015-05-15 ENCOUNTER — Encounter: Payer: Self-pay | Admitting: Nurse Practitioner

## 2015-05-15 VITALS — BP 100/60 | HR 80 | Ht 68.75 in | Wt 124.0 lb

## 2015-05-15 DIAGNOSIS — Z0000 Encounter for general adult medical examination without abnormal findings: Secondary | ICD-10-CM

## 2015-05-15 DIAGNOSIS — N921 Excessive and frequent menstruation with irregular cycle: Secondary | ICD-10-CM | POA: Diagnosis not present

## 2015-05-15 DIAGNOSIS — Z01419 Encounter for gynecological examination (general) (routine) without abnormal findings: Secondary | ICD-10-CM

## 2015-05-15 MED ORDER — SUMATRIPTAN SUCCINATE 100 MG PO TABS: 100.0000 mg | ORAL_TABLET | ORAL | Status: DC | PRN

## 2015-05-15 MED ORDER — SERTRALINE HCL 100 MG PO TABS: 100.0000 mg | ORAL_TABLET | Freq: Every day | ORAL | Status: DC

## 2015-05-15 MED ORDER — SUMATRIPTAN SUCCINATE 100 MG PO TABS: ORAL_TABLET | ORAL | Status: DC

## 2015-05-15 NOTE — Progress Notes (Signed)
Patient ID: Alyssa Reynolds, female   DOB: June 08, 1966, 49 y.o.   MRN: [id number] 49 y.o. G47P2002 Married  Caucasian Fe here for annual exam.  Menses becoming irregular and further apart. This last time 6/3 -6/9 or 4 heavy lased for a week.  Then 6/14 to present. PMP 03/03/16.  In March very light .  Since January menses has been irregular, heavier, longer, increase in clotting.  Patient's last menstrual period was 05/09/2015 (exact date).          Sexually active: Yes.    The current method of family planning is vasectomy.    Exercising: Yes.    walking and yoga Smoker:  no  Health Maintenance: Pap:  03/17/2012 normal with negative HR HPV MMG:  05/19/14, Bi-Rads 1:  negative, [facility] TDaP: 11/13/12 Labs:  Fasting completed on 04/10/15  Urine:  Negative on 04/10/15    reports that she has never smoked. She has never used smokeless tobacco. She reports that she drinks about 2.4 oz of alcohol per week. She reports that she does not use illicit drugs.  Past Medical History  Diagnosis Date  . Migraines   . 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 Depression   . Situational anxiety   . Infertility, female     took clomid    Past Surgical History  Procedure Laterality Date  . Breast enhancement surgery Bilateral 01/2007    implants  . Neuroma surgery    . Breast enhancement surgery Bilateral 5/09    reconstruction/replacement breast implants  . Cesarean section  3/98  . Foot surgery Right 12/2014    Current Outpatient Prescriptions  Medication Sig Dispense Refill  . acetaminophen (TYLENOL) 325 MG tablet Take 650 mg by mouth every 6 (six) hours as needed.     . Biotin 1 MG CAPS Take by mouth daily.    . Cholecalciferol (VITAMIN D) 1000 UNITS capsule Take 1,000 Units by mouth daily.    Marland Kitchen ibuprofen (ADVIL,MOTRIN) 200 MG tablet Take 200 mg by mouth every 6 (six) hours as needed.    . lactobacillus acidophilus (BACID) TABS Take 2 tablets by mouth daily.    .  Multiple Vitamins-Minerals (MULTIVITAMIN WITH MINERALS) tablet Take 1 tablet by mouth daily.      . sertraline (ZOLOFT) 100 MG tablet Take 1 tablet (100 mg total) by mouth daily. 90 tablet 3  . SUMAtriptan (IMITREX) 100 MG tablet Use as directed for headaches every 4 hrs. 27 tablet 3   No current facility-administered medications for this visit.    Family History  Problem Relation Age of Onset  . Breast cancer Maternal Grandmother 31  . Ovarian cancer Maternal Grandmother 91  . Melanoma Paternal Grandfather   . Hypertension Paternal Grandfather   . Heart attack Father     CABG  . Transient ischemic attack Father   . Melanoma Father   . Osteoarthritis Mother   . Melanoma Maternal Aunt   . Hypertension Maternal Grandfather   . Melanoma Maternal Grandfather   . Stroke Paternal Grandmother     ROS:  Pertinent items are noted in HPI.  Otherwise, a comprehensive ROS was negative.  Exam:   BP 100/60 mmHg  Pulse 80  Ht 5' 8.75" (1.746 m)  Wt 124 lb (56.246 kg)  BMI 18.45 kg/m2  LMP 05/09/2015 (Exact Date) Height: 5' 8.75" (174.6 cm) Ht Readings from Last 3 Encounters:  05/15/15 5' 8.75" (1.746 m)  04/10/15 5'  8.75" (1.746 m)  04/04/14 5' 8.25" (1.734 m)    General appearance: alert, cooperative and appears stated age Head: Normocephalic, without obvious abnormality, atraumatic Neck: no adenopathy, supple, symmetrical, trachea midline and thyroid normal to inspection and palpation Lungs: clear to auscultation bilaterally Breasts: normal appearance, no masses or tenderness, positive findings: implants bilaterally Heart: regular rate and rhythm Abdomen: soft, non-tender; no masses,  no organomegaly Extremities: extremities normal, atraumatic, no cyanosis or edema Skin: Skin color, texture, turgor normal. No rashes or lesions Lymph nodes: Cervical, supraclavicular, and axillary nodes normal. No abnormal inguinal nodes palpated Neurologic: Grossly normal   Pelvic: External  genitalia:  no lesions              Urethra:  normal appearing urethra with no masses, tenderness or lesions              Bartholin's and Skene's: normal                 Vagina: normal appearing vagina with normal color and moderate vaginal bleeding, no lesions              Cervix: anteverted              Pap taken: Yes.   Bimanual Exam:  Uterus:  normal size, contour, position, consistency, mobility, non-tender              Adnexa: no mass, fullness, tenderness               Rectovaginal: Confirms               Anus:  normal sphincter tone, no lesions  Chaperone present:  yes  A:  Well Woman with normal exam  Perimenopausal with irregular menses  Menorrhagia with irregular menses Situational anxiety and depression  History of migraine HA's on OCP  Husband with Vasectomy   P:   Reviewed health and wellness pertinent to exam  Pap smear as above  Mammogram is due and will schedule  Counseled on breast self exam, mammography screening, adequate intake of calcium and vitamin D, diet and exercise return annually or prn  An After Visit Summary was printed and given to the patient.

## 2015-05-15 NOTE — Patient Instructions (Signed)

## 2015-05-17 NOTE — Progress Notes (Signed)
Encounter reviewed by Dr. Josefa Half. Sonohysterogram ordered.

## 2015-05-18 ENCOUNTER — Ambulatory Visit: Payer: BLUE CROSS/BLUE SHIELD

## 2015-05-18 ENCOUNTER — Ambulatory Visit (INDEPENDENT_AMBULATORY_CARE_PROVIDER_SITE_OTHER): Payer: BLUE CROSS/BLUE SHIELD | Admitting: Podiatry

## 2015-05-18 ENCOUNTER — Encounter: Payer: Self-pay | Admitting: Podiatry

## 2015-05-18 VITALS — BP 123/62 | HR 70 | Resp 12

## 2015-05-18 DIAGNOSIS — M2041 Other hammer toe(s) (acquired), right foot: Secondary | ICD-10-CM

## 2015-05-18 DIAGNOSIS — M779 Enthesopathy, unspecified: Secondary | ICD-10-CM

## 2015-05-18 LAB — IPS PAP TEST WITH HPV

## 2015-05-19 NOTE — Progress Notes (Signed)
Subjective:     Patient ID: Alyssa Reynolds, female   DOB: [birth year], 49 y.o.   MRN: [id number]  HPI patient presents stating I'm doing a lot better with swelling still occurs if I been on my foot a lot and occasional dull pain but improving   Review of Systems     Objective:   Physical Exam Neurovascular status intact muscle strength adequate with continued improvement around the second metatarsophalangeal joint with second toe which is slightly elevated but in a reasonably good position with good range of motion of the MPJ with no crepitus    Assessment:     Doing well with post osteotomy digital fusion second metatarsal second digit right    Plan:     Reviewed final x-rays and allow patient to return to normal activities. Explained that final recovery will probably take another 6 months

## 2015-05-24 ENCOUNTER — Telehealth: Payer: Self-pay | Admitting: *Deleted

## 2015-05-24 NOTE — Telephone Encounter (Signed)
I have attempted to contact this patient by phone with the following results: left message to return call to [place] at [phone] on answering machine (mobile per [facility]).  Pt name verified in voicemail, advised message was regarding recent pap and message has also been sent via MyChart by Edman Circle, [place].  [phone] (Mobile) *Preferred*  Pt has not reviewed labs via MyChart at this time.

## 2015-05-24 NOTE — Telephone Encounter (Signed)
-----   Message from Kem Boroughs, [place] sent at 05/18/2015  6:10 PM EDT ----- Results via my chart:  Steph see pap note - she needs to return for a recheck pap  .Tailyn,  The pap was inadequate to review secondary to menses - so sorry but we should repeat the pap.  The HPV test was done and was negative.  Please call back and get apt for a pap only - report to front staff that this is a repeat.

## 2015-05-30 ENCOUNTER — Telehealth: Payer: Self-pay | Admitting: Nurse Practitioner

## 2015-05-30 NOTE — Telephone Encounter (Signed)
Called patient to review benefits for procedure. Left voicemail to call back and review. °

## 2015-06-15 NOTE — Telephone Encounter (Signed)
Patient called back scheduled her for repeat papsmear 06/21/15 at 11:15.  Routed to [employer], [place] and Kem Boroughs, FNP for [phone].

## 2015-06-16 ENCOUNTER — Telehealth: Payer: Self-pay | Admitting: *Deleted

## 2015-06-16 NOTE — Telephone Encounter (Signed)
-----   Message from Graylon Good, [place] sent at 06/15/2015 10:54 AM EDT ----- Regarding: repeat pap Gay Filler,  Pt has not called back or scheduled appt.  Please call to follow up. Thanks, steph   ----- Message -----    From: Kem Boroughs, FNP    Sent: 05/18/2015   6:10 PM      To: Graylon Good, CMA  Results via my chart:  Steph see pap note - she needs to return for a recheck pap  .[place],  The pap was inadequate to review secondary to menses - so sorry but we should repeat the pap.  The HPV test was done and was negative.  Please call back and get apt for a pap only - report to front staff that this is a repeat.

## 2015-06-16 NOTE — Telephone Encounter (Signed)
Call to patient home and cell number. Left message to call back to [place], nursing supervisor at [facility] office.

## 2015-06-21 ENCOUNTER — Encounter: Payer: Self-pay | Admitting: Nurse Practitioner

## 2015-06-21 ENCOUNTER — Ambulatory Visit (INDEPENDENT_AMBULATORY_CARE_PROVIDER_SITE_OTHER): Payer: BLUE CROSS/BLUE SHIELD | Admitting: Nurse Practitioner

## 2015-06-21 VITALS — Ht 68.75 in

## 2015-06-21 DIAGNOSIS — R87619 Unspecified abnormal cytological findings in specimens from cervix uteri: Secondary | ICD-10-CM

## 2015-06-21 DIAGNOSIS — R87615 Unsatisfactory cytologic smear of cervix: Secondary | ICD-10-CM

## 2015-06-21 NOTE — Progress Notes (Signed)
49 y.o. MW Female here for a repeat pap.  The pap done at [facility] was insufficient cells since she was having heavy bleeding at the time of collection so the pap only had to be repeated.  She does say that this last menses was not as heavy and not as long.  She still may consider other treatment options such as the IUD.   O: Healthy WD,WN female Affect: normal Abdomen: not examined Pelvic exam: pap is colleted, rest of exam is done at [facility]  A:  unsatisfactory pap secondary to heavy menses  Recent changes with menorrhagia   P:  Will follow with pap - 02 if normal   Labs :  Pap only  No OV charged  RV

## 2015-06-24 NOTE — Progress Notes (Signed)
Encounter reviewed by Dr. Brook Amundson C. Silva.  

## 2015-06-27 LAB — IPS PAP TEST WITH HPV

## 2015-06-27 NOTE — Telephone Encounter (Signed)
Patient seen on 06-21-15. Encounter closed.  Routing to provider for final review.

## 2015-06-27 NOTE — Addendum Note (Signed)
Addended by: Antonietta Barcelona on: 06/27/2015 08:14 AM   Modules accepted: Orders

## 2015-06-28 LAB — IPS HPV GENOTYPING 16/18

## 2015-06-29 ENCOUNTER — Telehealth: Payer: Self-pay | Admitting: Emergency Medicine

## 2015-06-29 NOTE — Telephone Encounter (Deleted)
-----   Message from Kem Boroughs, [place] sent at 06/28/2015  4:56 PM EDT ----- Please let pt. Know that pap was normal but HPV test was positive.  We then did genotype for HPV 16/18/45 which was negative.  She must get pap and HR HPV next year.  08 recall

## 2015-06-29 NOTE — Telephone Encounter (Signed)
Encounter opened in error

## 2015-06-30 ENCOUNTER — Telehealth: Payer: Self-pay | Admitting: Nurse Practitioner

## 2015-06-30 ENCOUNTER — Encounter: Payer: Self-pay | Admitting: Nurse Practitioner

## 2015-06-30 DIAGNOSIS — Z83719 Family history of colon polyps, unspecified: Secondary | ICD-10-CM

## 2015-06-30 DIAGNOSIS — Z8371 Family history of colonic polyps: Secondary | ICD-10-CM

## 2015-06-30 NOTE — Telephone Encounter (Signed)
Pt is informed about this pap being normal and HPV on this pap was positive but negative for #16 & 18.  She is informed that she needs a repeat pap with HPV in a year.  While on the phone she also says she has a strong [facility] of colon polyps that she has just been informed about.  Brother just had 4-5 polyps on a colonoscopy and pathology is not back.  She would like to plan screening colonoscopy.

## 2015-08-03 ENCOUNTER — Other Ambulatory Visit: Payer: Self-pay

## 2015-08-03 DIAGNOSIS — Z1231 Encounter for screening mammogram for malignant neoplasm of breast: Secondary | ICD-10-CM

## 2015-08-11 ENCOUNTER — Ambulatory Visit
Admission: RE | Admit: 2015-08-11 | Discharge: 2015-08-11 | Disposition: A | Discharge status: Home / Self Care | Payer: BLUE CROSS/BLUE SHIELD | Source: Ambulatory Visit

## 2015-08-11 DIAGNOSIS — Z1231 Encounter for screening mammogram for malignant neoplasm of breast: Secondary | ICD-10-CM

## 2015-10-04 ENCOUNTER — Encounter: Payer: Self-pay | Admitting: *Deleted

## 2016-05-15 ENCOUNTER — Encounter: Payer: Self-pay | Admitting: Nurse Practitioner

## 2016-05-15 ENCOUNTER — Ambulatory Visit (INDEPENDENT_AMBULATORY_CARE_PROVIDER_SITE_OTHER): Payer: BLUE CROSS/BLUE SHIELD | Admitting: Nurse Practitioner

## 2016-05-15 VITALS — BP 102/60 | HR 84 | Resp 16 | Ht 68.75 in | Wt 131.0 lb

## 2016-05-15 DIAGNOSIS — N926 Irregular menstruation, unspecified: Secondary | ICD-10-CM

## 2016-05-15 DIAGNOSIS — Z0000 Encounter for general adult medical examination without abnormal findings: Secondary | ICD-10-CM

## 2016-05-15 DIAGNOSIS — Z01419 Encounter for gynecological examination (general) (routine) without abnormal findings: Secondary | ICD-10-CM

## 2016-05-15 DIAGNOSIS — R87619 Unspecified abnormal cytological findings in specimens from cervix uteri: Secondary | ICD-10-CM | POA: Diagnosis not present

## 2016-05-15 LAB — POCT URINALYSIS DIPSTICK
BILIRUBIN UA: NEGATIVE
GLUCOSE UA: NEGATIVE
Ketones, UA: NEGATIVE
Leukocytes, UA: NEGATIVE
Nitrite, UA: NEGATIVE
Protein, UA: NEGATIVE
RBC UA: NEGATIVE
UROBILINOGEN UA: NEGATIVE
pH, UA: 5

## 2016-05-15 MED ORDER — HYDROXYZINE PAMOATE 25 MG PO CAPS: 25.0000 mg | ORAL_CAPSULE | Freq: Four times a day (QID) | ORAL | Status: DC | PRN

## 2016-05-15 MED ORDER — SUMATRIPTAN SUCCINATE 100 MG PO TABS: ORAL_TABLET | ORAL | Status: DC

## 2016-05-15 MED ORDER — ETONOGESTREL-ETHINYL ESTRADIOL 0.12-0.015 MG/24HR VA RING: VAGINAL_RING | VAGINAL | Status: DC

## 2016-05-15 MED ORDER — SERTRALINE HCL 100 MG PO TABS: 100.0000 mg | ORAL_TABLET | Freq: Every day | ORAL | Status: DC

## 2016-05-15 NOTE — Patient Instructions (Signed)

## 2016-05-15 NOTE — Progress Notes (Addendum)
50 y.o. G34P2002 Married  Caucasian Fe here for annual exam.  Menses for 9-11 days and at 21-28 days.  Still heavy for 2 -4 days.  Some cramps and some PMS. Now wiling try Nuva Ring for cycle regulation.    Patient's last menstrual period was 05/06/2016.          Sexually active: Yes.    The current method of family planning is vasectomy.    Exercising: Yes.    Walking, lifting weights, yoga Smoker:  no  Health Maintenance: Pap:  06/21/15 Neg. HR HPV:+Detected. 16, 18/45 Neg.  MMG:  08/14/15 BIRADS1:neg Colonoscopy: 07/2015 Normal - f/u 3-5 years  BMD:  Heel test 15 years ago.  TDaP:  11/13/2012  HIV: gives blood and with pregnancy in 2001 Labs: Here Hg:11.5 UA: Negative   reports that she has never smoked. She has never used smokeless tobacco. She reports that she drinks about 2.4 oz of alcohol per week. She reports that she does not use illicit drugs.  Past Medical History  Diagnosis Date  . Migraines     w/o aura  . 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 Depression   . Situational anxiety   . Infertility, female     took clomid    Past Surgical History  Procedure Laterality Date  . Breast enhancement surgery Bilateral 01/2007    implants  . Neuroma surgery    . Breast enhancement surgery Bilateral 5/09    reconstruction/replacement breast implants  . Cesarean section  3/98  . Foot surgery Right 12/2014    Current Outpatient Prescriptions  Medication Sig Dispense Refill  . acetaminophen (TYLENOL) 325 MG tablet Take 650 mg by mouth every 6 (six) hours as needed.     . Biotin 1 MG CAPS Take by mouth daily.    . Cholecalciferol (VITAMIN D) 1000 UNITS capsule Take 1,000 Units by mouth daily.    . hydrOXYzine (ATARAX/VISTARIL) 10 MG tablet Take 10 mg by mouth at bedtime as needed.    . hydrOXYzine (VISTARIL) 25 MG capsule Take 1 capsule (25 mg total) by mouth every 6 (six) hours as needed. 30 capsule 0  . ibuprofen (ADVIL,MOTRIN) 200 MG tablet Take 200  mg by mouth every 6 (six) hours as needed.    . lactobacillus acidophilus (BACID) TABS Take 2 tablets by mouth daily.    Marland Kitchen levocetirizine (XYZAL ALLERGY 24HR) 5 MG tablet Take 5 mg by mouth every evening.    . Multiple Vitamins-Minerals (MULTIVITAMIN WITH MINERALS) tablet Take 1 tablet by mouth daily.      . sertraline (ZOLOFT) 100 MG tablet Take 1 tablet (100 mg total) by mouth daily. 90 tablet 4  . SUMAtriptan (IMITREX) 100 MG tablet Use as directed for headaches every 4 hrs. 27 tablet 4  . etonogestrel-ethinyl estradiol (NUVARING) 0.12-0.015 MG/24HR vaginal ring Insert vaginally and leave in place for 3 consecutive weeks, then remove for 1 week. 3 each 4  . metroNIDAZOLE (METROGEL) 0.75 % vaginal gel Place 1 Applicatorful vaginally at bedtime. 70 g 0   No current facility-administered medications for this visit.    Family History  Problem Relation Age of Onset  . Breast cancer Maternal Grandmother 79  . Ovarian cancer Maternal Grandmother 91  . Melanoma Paternal Grandfather   . Hypertension Paternal Grandfather   . Heart attack Father     CABG  . Transient ischemic attack Father   . Melanoma Father   . Osteoarthritis  Mother   . Melanoma Maternal Aunt   . Hypertension Maternal Grandfather   . Melanoma Maternal Grandfather   . Stroke Paternal Grandmother   . Colon polyps Brother     father , brother,  PGU X 2  . Osteoporosis Paternal Grandmother     ROS:  Pertinent items are noted in HPI.  Otherwise, a comprehensive ROS was negative.  Exam:   BP 102/60 mmHg  Pulse 84  Resp 16  Ht 5' 8.75" (1.746 m)  Wt 131 lb (59.421 kg)  BMI 19.49 kg/m2  LMP 05/06/2016 Height: 5' 8.75" (174.6 cm) Ht Readings from Last 3 Encounters:  05/15/16 5' 8.75" (1.746 m)  06/21/15 5' 8.75" (1.746 m)  05/15/15 5' 8.75" (1.746 m)    General appearance: alert, cooperative and appears stated age Head: Normocephalic, without obvious abnormality, atraumatic Neck: no adenopathy, supple, symmetrical,  trachea midline and thyroid normal to inspection and palpation Lungs: clear to auscultation bilaterally Breasts: normal appearance, no masses or tenderness, positive findings: implants bilaterally Heart: regular rate and rhythm Abdomen: soft, non-tender; no masses,  no organomegaly Extremities: extremities normal, atraumatic, no cyanosis or edema Skin: Skin color, texture, turgor normal. No rashes or lesions Lymph nodes: Cervical, supraclavicular, and axillary nodes normal. No abnormal inguinal nodes palpated Neurologic: Grossly normal   Pelvic: External genitalia:  no lesions              Urethra:  normal appearing urethra with no masses, tenderness or lesions              Bartholin's and Skene's: normal                 Vagina: normal appearing vagina with normal color and discharge, no lesions              Cervix: anteverted              Pap taken: Yes.   Bimanual Exam:  Uterus:  normal size, contour, position, consistency, mobility, non-tender              Adnexa: no mass, fullness, tenderness               Rectovaginal: Confirms               Anus:  normal sphincter tone, no lesions  Chaperone present: yes  A:  Well Woman with normal exam  Perimenopausal with irregular menses Menorrhagia with irregular menses - now willing to try treatment Situational anxiety and depression History of migraine HA's without aura's on OCP  Husband with Vasectomy  P:   Reviewed health and wellness pertinent to exam  Pap smear as above  Mammogram is due 07/2016  Refill on Imitrex and Zoloft to [facility]  Refill on Hydroxyzine for allergies to reduce allergic reaction on legs  New RX for [employer] sent to [facility] - she will let us know if not helpful with menses regulation.  She is given instructions, and potential risk and SE of medication.  Counseled on breast self exam, mammography screening, use and side effects of Nuva Ring, adequate  intake of calcium and vitamin D, diet and exercise, Kegel's exercises return annually or prn  An After Visit Summary was printed and given to the patient.

## 2016-05-15 NOTE — Progress Notes (Signed)
Encounter reviewed. Please confirm that the patient doesn't have a h/o aura's with her migraines. Shouldn't be on the nuvaring if she has auras.  [place] Boast, MD

## 2016-05-17 ENCOUNTER — Other Ambulatory Visit: Payer: Self-pay | Admitting: Nurse Practitioner

## 2016-05-17 ENCOUNTER — Encounter: Payer: Self-pay | Admitting: Nurse Practitioner

## 2016-05-17 LAB — IPS PAP TEST WITH HPV

## 2016-05-17 MED ORDER — METRONIDAZOLE 0.75 % VA GEL: 1.0000 | Freq: Every day | VAGINAL | Status: DC

## 2016-05-29 LAB — HEMOGLOBIN, FINGERSTICK: HEMOGLOBIN, FINGERSTICK: 11.5 g/dL — AB (ref 12.0–16.0)

## 2016-05-30 ENCOUNTER — Encounter: Payer: Self-pay | Admitting: Nurse Practitioner

## 2016-05-30 ENCOUNTER — Telehealth: Payer: Self-pay | Admitting: Emergency Medicine

## 2016-05-30 MED ORDER — METRONIDAZOLE 0.75 % VA GEL: VAGINAL | Status: DC

## 2016-05-30 NOTE — Telephone Encounter (Signed)
Reviewed chart. Appears Metrogel was sent to [facility] Mail order.  Call to patient home per designated party release form, unable to leave message.  Call to patient voicemail and did not leave a message.   Order placed to [place] on [address].

## 2016-05-30 NOTE — Telephone Encounter (Signed)
Chief Complaint  Patient presents with  . Medication Management  . Mychart Message    ===View-only below this line===   ----- Message -----    From: Harrie Jeans    Sent: 05/30/2016  9:45 AM EDT      To: Kem Boroughs, FNP Subject: Non-Urgent Medical Question  Noe Gens, I called my pharmacy [facility] [address]) but they did not have a Rx for me to pick up.  Did you send it to another pharmacy?

## 2016-06-04 NOTE — Telephone Encounter (Signed)
Call to patient and she will pick up an applicator from our office.  Will close encounter.

## 2016-07-25 ENCOUNTER — Other Ambulatory Visit: Payer: Self-pay | Admitting: Nurse Practitioner

## 2016-07-25 NOTE — Telephone Encounter (Signed)
Patient had Rx sent in 05/15/16 with #3 with 4R. Spoke to pharmacy and they are filling the Rx.

## 2016-07-25 NOTE — Telephone Encounter (Signed)
Patient is on a trial of Nuvoring. Patient likes and would like more refills called to the pharmacy on file.

## 2016-08-30 ENCOUNTER — Other Ambulatory Visit: Payer: Self-pay | Admitting: Nurse Practitioner

## 2016-08-30 NOTE — Telephone Encounter (Signed)
Spoke with patient, she will call the pharmacy to see if they have refill. Closing this encounter

## 2016-08-30 NOTE — Telephone Encounter (Signed)
Patient calling for refill on nuvaring to be sent to [facility] on [address] at [phone].

## 2016-08-30 NOTE — Telephone Encounter (Signed)
Medication refill request: NuvaRing Last [facility]:  05/15/16 [facility] Next [facility]: 05/19/17 Last MMG (if hormonal medication request): 08/11/15 BIRADS1 negative Refill authorized: 05/15/16 #3 w/4refills; patient should have enough refills  Will call patient to verify

## 2016-09-05 ENCOUNTER — Telehealth: Payer: Self-pay | Admitting: Nurse Practitioner

## 2016-09-05 NOTE — Telephone Encounter (Signed)
Patient returned call. Fasting lab appointment scheduled for Monday 09/09/16 at 0915. Patient agreeable to date and time of appointment.   Routing to provider for final review. Patient agreeable to disposition. Will close encounter.

## 2016-09-05 NOTE — Telephone Encounter (Signed)
Attempted to reach patient to schedule lab appointment. Voicemail box full.

## 2016-09-05 NOTE — Telephone Encounter (Signed)
Patient dropped off a short form to be completed with her that needs her Trigs, HDL, Total Chol, LDL and Glucose. Patient did not have labs done at her [facility]. Patient would like to come for the labs.  The patient's form will be in the patient pick up drawer until labs are done.

## 2016-09-09 ENCOUNTER — Other Ambulatory Visit (INDEPENDENT_AMBULATORY_CARE_PROVIDER_SITE_OTHER): Payer: BLUE CROSS/BLUE SHIELD

## 2016-09-09 ENCOUNTER — Other Ambulatory Visit: Payer: Self-pay | Admitting: Nurse Practitioner

## 2016-09-09 DIAGNOSIS — Z0000 Encounter for general adult medical examination without abnormal findings: Secondary | ICD-10-CM | POA: Diagnosis not present

## 2016-09-10 LAB — LIPID PANEL
Cholesterol: 139 mg/dL (ref 125–200)
HDL: 64 mg/dL (ref >=46)
LDL Cholesterol: 54 mg/dL (ref <130)
TRIGLYCERIDES: 104 mg/dL (ref <150)
Total CHOL/HDL Ratio: 2.2 Ratio (ref <=5.0)
VLDL: 21 mg/dL (ref <30)

## 2016-09-10 LAB — COMPREHENSIVE METABOLIC PANEL
ALBUMIN: 4.2 g/dL (ref 3.6–5.1)
ALK PHOS: 32 U/L — AB (ref 33–130)
ALT: 9 U/L (ref 6–29)
AST: 14 U/L (ref 10–35)
BILIRUBIN TOTAL: 0.5 mg/dL (ref 0.2–1.2)
BUN: 12 mg/dL (ref 7–25)
CALCIUM: 9.3 mg/dL (ref 8.6–10.4)
CO2: 25 mmol/L (ref 20–31)
CREATININE: 0.71 mg/dL (ref 0.50–1.05)
Chloride: 104 mmol/L (ref 98–110)
Glucose, Bld: 93 mg/dL (ref 65–99)
Potassium: 4.7 mmol/L (ref 3.5–5.3)
Sodium: 137 mmol/L (ref 135–146)
TOTAL PROTEIN: 6.4 g/dL (ref 6.1–8.1)

## 2016-11-08 ENCOUNTER — Other Ambulatory Visit: Payer: Self-pay | Admitting: Nurse Practitioner

## 2016-11-08 ENCOUNTER — Telehealth: Payer: Self-pay | Admitting: Nurse Practitioner

## 2016-11-08 MED ORDER — SUMATRIPTAN SUCCINATE 100 MG PO TABS: ORAL_TABLET | ORAL | 4 refills | Status: DC

## 2016-11-08 NOTE — Telephone Encounter (Signed)
Order was corrected and sent to pharmacy.

## 2016-12-24 ENCOUNTER — Other Ambulatory Visit: Payer: Self-pay | Admitting: Nurse Practitioner

## 2016-12-24 DIAGNOSIS — Z1231 Encounter for screening mammogram for malignant neoplasm of breast: Secondary | ICD-10-CM

## 2016-12-25 DIAGNOSIS — M62838 Other muscle spasm: Secondary | ICD-10-CM | POA: Diagnosis not present

## 2016-12-25 DIAGNOSIS — M542 Cervicalgia: Secondary | ICD-10-CM | POA: Diagnosis not present

## 2016-12-25 DIAGNOSIS — M9902 Segmental and somatic dysfunction of thoracic region: Secondary | ICD-10-CM | POA: Diagnosis not present

## 2016-12-25 DIAGNOSIS — M9901 Segmental and somatic dysfunction of cervical region: Secondary | ICD-10-CM | POA: Diagnosis not present

## 2017-01-24 ENCOUNTER — Ambulatory Visit
Admission: RE | Admit: 2017-01-24 | Discharge: 2017-01-24 | Disposition: A | Discharge status: Home / Self Care | Payer: BLUE CROSS/BLUE SHIELD | Source: Ambulatory Visit | Attending: Nurse Practitioner | Admitting: Nurse Practitioner

## 2017-01-24 DIAGNOSIS — Z1231 Encounter for screening mammogram for malignant neoplasm of breast: Secondary | ICD-10-CM

## 2017-02-25 DIAGNOSIS — M542 Cervicalgia: Secondary | ICD-10-CM | POA: Diagnosis not present

## 2017-02-25 DIAGNOSIS — M546 Pain in thoracic spine: Secondary | ICD-10-CM | POA: Diagnosis not present

## 2017-02-25 DIAGNOSIS — G44209 Tension-type headache, unspecified, not intractable: Secondary | ICD-10-CM | POA: Diagnosis not present

## 2017-02-25 DIAGNOSIS — M9901 Segmental and somatic dysfunction of cervical region: Secondary | ICD-10-CM | POA: Diagnosis not present

## 2017-03-10 DIAGNOSIS — M9901 Segmental and somatic dysfunction of cervical region: Secondary | ICD-10-CM | POA: Diagnosis not present

## 2017-03-10 DIAGNOSIS — M542 Cervicalgia: Secondary | ICD-10-CM | POA: Diagnosis not present

## 2017-03-10 DIAGNOSIS — G44209 Tension-type headache, unspecified, not intractable: Secondary | ICD-10-CM | POA: Diagnosis not present

## 2017-03-10 DIAGNOSIS — M546 Pain in thoracic spine: Secondary | ICD-10-CM | POA: Diagnosis not present

## 2017-03-17 DIAGNOSIS — M542 Cervicalgia: Secondary | ICD-10-CM | POA: Diagnosis not present

## 2017-03-17 DIAGNOSIS — G44209 Tension-type headache, unspecified, not intractable: Secondary | ICD-10-CM | POA: Diagnosis not present

## 2017-03-17 DIAGNOSIS — M546 Pain in thoracic spine: Secondary | ICD-10-CM | POA: Diagnosis not present

## 2017-03-17 DIAGNOSIS — M9901 Segmental and somatic dysfunction of cervical region: Secondary | ICD-10-CM | POA: Diagnosis not present

## 2017-04-10 DIAGNOSIS — H5213 Myopia, bilateral: Secondary | ICD-10-CM | POA: Diagnosis not present

## 2017-05-19 ENCOUNTER — Encounter: Payer: Self-pay | Admitting: Nurse Practitioner

## 2017-05-19 ENCOUNTER — Ambulatory Visit (INDEPENDENT_AMBULATORY_CARE_PROVIDER_SITE_OTHER): Payer: BLUE CROSS/BLUE SHIELD | Admitting: Nurse Practitioner

## 2017-05-19 ENCOUNTER — Other Ambulatory Visit (HOSPITAL_COMMUNITY)
Admission: RE | Admit: 2017-05-19 | Discharge: 2017-05-19 | Disposition: A | Discharge status: Home / Self Care | Payer: BLUE CROSS/BLUE SHIELD | Source: Ambulatory Visit | Attending: Nurse Practitioner | Admitting: Nurse Practitioner

## 2017-05-19 VITALS — BP 108/66 | HR 76 | Ht 68.5 in | Wt 129.0 lb

## 2017-05-19 DIAGNOSIS — R87619 Unspecified abnormal cytological findings in specimens from cervix uteri: Secondary | ICD-10-CM

## 2017-05-19 DIAGNOSIS — Z83719 Family history of colon polyps, unspecified: Secondary | ICD-10-CM

## 2017-05-19 DIAGNOSIS — Z01419 Encounter for gynecological examination (general) (routine) without abnormal findings: Secondary | ICD-10-CM | POA: Diagnosis not present

## 2017-05-19 DIAGNOSIS — Z8371 Family history of colonic polyps: Secondary | ICD-10-CM | POA: Diagnosis not present

## 2017-05-19 DIAGNOSIS — Z0000 Encounter for general adult medical examination without abnormal findings: Secondary | ICD-10-CM | POA: Insufficient documentation

## 2017-05-19 MED ORDER — HYDROXYZINE PAMOATE 25 MG PO CAPS: 25.0000 mg | ORAL_CAPSULE | Freq: Four times a day (QID) | ORAL | 0 refills | Status: DC | PRN

## 2017-05-19 MED ORDER — HYDROXYZINE HCL 10 MG PO TABS: 10.0000 mg | ORAL_TABLET | Freq: Every evening | ORAL | 4 refills | Status: DC | PRN

## 2017-05-19 MED ORDER — SUMATRIPTAN SUCCINATE 100 MG PO TABS: ORAL_TABLET | ORAL | 4 refills | Status: DC

## 2017-05-19 MED ORDER — SERTRALINE HCL 100 MG PO TABS: 100.0000 mg | ORAL_TABLET | Freq: Every day | ORAL | 4 refills | Status: DC

## 2017-05-19 MED ORDER — ETONOGESTREL-ETHINYL ESTRADIOL 0.12-0.015 MG/24HR VA RING: VAGINAL_RING | VAGINAL | 4 refills | Status: DC

## 2017-05-19 NOTE — Patient Instructions (Signed)

## 2017-05-19 NOTE — Progress Notes (Signed)
Patient ID: Alyssa Reynolds, female   DOB: 1966-04-08, 51 y.o.   MRN: [id number]  51 y.o. [id number] Married  Caucasian Fe here for annual exam.  Menses this past year is regular on [employer].  Some months is very heavy at 2 days lasting for 5-6 days.  Some months lighter.  Some cramps with a heavier flow.  Some increase in HA's past few month before cycle.  Takes Imitrex prn HA's.  For the chronic rash on her lower legs she is alternating Xyzal and Hydroxyzine.  Rash is so much better. She is also doing well on Zoloft for anxiety.  There is some mood changes with menses but tolerable.  Feels 'warm' but no vaso symptoms.  Patient's last menstrual period was 05/06/2017.          Sexually active: Yes.    The current method of family planning is husband with vasectomy & Nuva Ring vaginal inserts.    Exercising: Yes.    Home exercise routine includes treadmill and yoga.  Smoker:  no  Health Maintenance: Pap: 05/15/16, Negative with neg HR HPV  06/21/15, Negative with pos HR HPV, neg 16/18/45 History of Abnormal Pap: yes, pos HR HPV 2016 MMG: 01/24/17, 3D-yes, Density Category C, Bi-Rads 1:  Negative Self Breast exams: yes Colonoscopy: 07/2015, normal, repeat in 3-5 years BMD: 1998, heel test, normal TDaP: 01/15/12 Labs: discuss today, new insurance - will return fasting    reports that she has never smoked. She has never used smokeless tobacco. She reports that she drinks about 2.4 oz of alcohol per week . She reports that she does not use drugs.  Past Medical History:  Diagnosis Date  . Depression   . 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 Infertility, female    took clomid  . Migraines    w/o aura  . Situational anxiety     Past Surgical History:  Procedure Laterality Date  . AUGMENTATION MAMMAPLASTY Bilateral   . BREAST ENHANCEMENT SURGERY Bilateral 01/2007   implants  . BREAST ENHANCEMENT SURGERY Bilateral 5/09   reconstruction/replacement breast implants  .  CESAREAN SECTION  3/98  . FOOT SURGERY Right 12/2014  . NEUROMA SURGERY      Current Outpatient Prescriptions  Medication Sig Dispense Refill  . acetaminophen (TYLENOL) 325 MG tablet Take 650 mg by mouth every 6 (six) hours as needed.     . ASHWAGANDHA PO Take by mouth daily.    . Cholecalciferol (VITAMIN D) 1000 UNITS capsule Take 1,000 Units by mouth daily.    Marland Kitchen etonogestrel-ethinyl estradiol (NUVARING) 0.12-0.015 MG/24HR vaginal ring Insert vaginally and leave in place for 3 consecutive weeks, then remove for 1 week. 3 each 4  . Ferrous Sulfate (SLOW FE) 142 (45 Fe) MG TBCR Take 1 tablet by mouth daily.    . hydrOXYzine (ATARAX/VISTARIL) 10 MG tablet Take 10 mg by mouth at bedtime as needed.    . hydrOXYzine (VISTARIL) 25 MG capsule Take 1 capsule (25 mg total) by mouth every 6 (six) hours as needed. 30 capsule 0  . ibuprofen (ADVIL,MOTRIN) 200 MG tablet Take 200 mg by mouth every 6 (six) hours as needed.    Marland Kitchen levocetirizine (XYZAL ALLERGY 24HR) 5 MG tablet Take 5 mg by mouth every evening.    . sertraline (ZOLOFT) 100 MG tablet Take 1 tablet (100 mg total) by mouth daily. 90 tablet 4  . SUMAtriptan (IMITREX) 100 MG tablet Use as directed for headaches  with 1 at onset and repeat in 2 hrs for a max of 2 tablets in 24 hours. 27 tablet 4   No current facility-administered medications for this visit.     Family History  Problem Relation Age of Onset  . Breast cancer Maternal Grandmother 108  . Ovarian cancer Maternal Grandmother 91  . Melanoma Paternal Grandfather   . Hypertension Paternal Grandfather   . Heart attack Father        CABG  . Transient ischemic attack Father   . Melanoma Father   . Osteoarthritis Mother   . Melanoma Maternal Aunt   . Rheum arthritis Maternal Aunt   . Hypertension Maternal Grandfather   . Melanoma Maternal Grandfather   . Stroke Paternal Grandmother   . Osteoporosis Paternal Grandmother   . Colon polyps Brother        father , brother,  PGU X 2     ROS:  Pertinent items are noted in HPI.  Otherwise, a comprehensive ROS was negative.  Exam:   BP 108/66 (BP Location: Left Arm, Patient Position: Sitting, Cuff Size: Normal)   Pulse 76   Ht 5' 8.5" (1.74 m)   Wt 129 lb (58.5 kg)   LMP 05/06/2017   BMI 19.33 kg/m  Height: 5' 8.5" (174 cm) Ht Readings from Last 3 Encounters:  05/19/17 5' 8.5" (1.74 m)  05/15/16 5' 8.75" (1.746 m)  06/21/15 5' 8.75" (1.746 m)    General appearance: alert, cooperative and appears stated age Head: Normocephalic, without obvious abnormality, atraumatic Neck: no adenopathy, supple, symmetrical, trachea midline and thyroid normal to inspection and palpation Lungs: clear to auscultation bilaterally Breasts: normal appearance, no masses or tenderness, positive findings: implants bilaterally Heart: regular rate and rhythm Abdomen: soft, non-tender; no masses,  no organomegaly Extremities: extremities normal, atraumatic, no cyanosis or edema Skin: Skin color, texture, turgor normal. No rashes or lesions Lymph nodes: Cervical, supraclavicular, and axillary nodes normal. No abnormal inguinal nodes palpated Neurologic: Grossly normal   Pelvic: External genitalia:  no lesions              Urethra:  normal appearing urethra with no masses, tenderness or lesions              Bartholin's and Skene's: normal                 Vagina: normal appearing vagina with normal color and discharge, no lesions              Cervix: anteverted              Pap taken: Yes.   Bimanual Exam:  Uterus:  normal size, contour, position, consistency, mobility, non-tender              Adnexa: no mass, fullness, tenderness               Rectovaginal: Confirms               Anus:  normal sphincter tone, no lesions  Chaperone present: yes  A:  Well Woman with normal exam  Perimenopausal   Situational anxiety and depression  History of migraine w/o aura  Husband with vasectomy  S/P breast augmentation  [place] of colon  polyps   P:   Reviewed health and wellness pertinent to exam  Pap smear: yes  Mammogram is due 3/19  Refill Nuva Ring & discussed about stopping the Nuva Ring maybe at next AEX.  She does not want HRT if she  can avoid  Will return to get labs - fasting  Refill on Zoloft and Imitrex  Counseled on breast self exam, mammography screening, use and side effects of Nuva Ring, adequate intake of calcium and vitamin D, diet and exercise, Kegel's exercises return annually or prn  An After Visit Summary was printed and given to the patient.

## 2017-05-21 LAB — CYTOLOGY - PAP
DIAGNOSIS: NEGATIVE
HPV (WINDOPATH): NOT DETECTED

## 2017-05-21 NOTE — Progress Notes (Signed)
Encounter reviewed Susa Bones, MD   

## 2017-05-22 ENCOUNTER — Other Ambulatory Visit (INDEPENDENT_AMBULATORY_CARE_PROVIDER_SITE_OTHER): Payer: BLUE CROSS/BLUE SHIELD

## 2017-05-22 DIAGNOSIS — Z0000 Encounter for general adult medical examination without abnormal findings: Secondary | ICD-10-CM

## 2017-05-23 LAB — COMPREHENSIVE METABOLIC PANEL
ALBUMIN: 4.3 g/dL (ref 3.5–5.5)
ALT: 10 IU/L (ref 0–32)
AST: 18 IU/L (ref 0–40)
Albumin/Globulin Ratio: 1.9 (ref 1.2–2.2)
Alkaline Phosphatase: 41 IU/L (ref 39–117)
BILIRUBIN TOTAL: 0.4 mg/dL (ref 0.0–1.2)
BUN / CREAT RATIO: 14 (ref 9–23)
BUN: 10 mg/dL (ref 6–24)
CALCIUM: 9.3 mg/dL (ref 8.7–10.2)
CHLORIDE: 102 mmol/L (ref 96–106)
CO2: 22 mmol/L (ref 20–29)
CREATININE: 0.74 mg/dL (ref 0.57–1.00)
GFR, EST AFRICAN AMERICAN: 108 mL/min/{1.73_m2} (ref >59)
GFR, EST NON AFRICAN AMERICAN: 94 mL/min/{1.73_m2} (ref >59)
GLUCOSE: 87 mg/dL (ref 65–99)
Globulin, Total: 2.3 g/dL (ref 1.5–4.5)
Potassium: 5.4 mmol/L — ABNORMAL HIGH (ref 3.5–5.2)
Sodium: 140 mmol/L (ref 134–144)
Total Protein: 6.6 g/dL (ref 6.0–8.5)

## 2017-05-23 LAB — LIPID PANEL
CHOLESTEROL TOTAL: 160 mg/dL (ref 100–199)
Chol/HDL Ratio: 2.5 ratio (ref 0.0–4.4)
HDL: 64 mg/dL (ref >39)
LDL CALC: 69 mg/dL (ref 0–99)
Triglycerides: 136 mg/dL (ref 0–149)
VLDL Cholesterol Cal: 27 mg/dL (ref 5–40)

## 2017-05-23 LAB — CBC
HEMOGLOBIN: 12.8 g/dL (ref 11.1–15.9)
Hematocrit: 37.4 % (ref 34.0–46.6)
MCH: 30.5 pg (ref 26.6–33.0)
MCHC: 34.2 g/dL (ref 31.5–35.7)
MCV: 89 fL (ref 79–97)
PLATELETS: 199 10*3/uL (ref 150–379)
RBC: 4.2 x10E6/uL (ref 3.77–5.28)
RDW: 13 % (ref 12.3–15.4)
WBC: 3.8 10*3/uL (ref 3.4–10.8)

## 2017-05-23 LAB — VITAMIN D 25 HYDROXY (VIT D DEFICIENCY, FRACTURES): Vit D, 25-Hydroxy: 32.7 ng/mL (ref 30.0–100.0)

## 2017-05-23 LAB — TSH: TSH: 1.3 u[IU]/mL (ref 0.450–4.500)

## 2017-06-27 ENCOUNTER — Telehealth: Payer: Self-pay | Admitting: Obstetrics and Gynecology

## 2017-06-27 NOTE — Telephone Encounter (Signed)
Left message for patient to call to reschedule Alyssa Reynolds appointment. °

## 2017-07-16 ENCOUNTER — Ambulatory Visit (INDEPENDENT_AMBULATORY_CARE_PROVIDER_SITE_OTHER): Payer: BLUE CROSS/BLUE SHIELD

## 2017-07-16 ENCOUNTER — Ambulatory Visit (INDEPENDENT_AMBULATORY_CARE_PROVIDER_SITE_OTHER): Payer: BLUE CROSS/BLUE SHIELD | Admitting: Podiatry

## 2017-07-16 ENCOUNTER — Other Ambulatory Visit: Payer: Self-pay | Admitting: Podiatry

## 2017-07-16 DIAGNOSIS — M79672 Pain in left foot: Secondary | ICD-10-CM

## 2017-07-16 DIAGNOSIS — M779 Enthesopathy, unspecified: Secondary | ICD-10-CM

## 2017-07-16 MED ORDER — TRIAMCINOLONE ACETONIDE 10 MG/ML IJ SUSP
10.0000 mg | Freq: Once | INTRAMUSCULAR | Status: AC
  Administered 2017-07-16: 10 mg

## 2017-07-17 NOTE — Progress Notes (Signed)
Subjective:    Patient ID: Alyssa Reynolds, female   DOB: 51 y.o.   MRN: [id number]   HPI patient states my left foot has become very painful and it's making hard for me to walk and it's the same problem I had with the right    ROS      Objective:  Physical Exam neurovascular status intact with patient noted to have inflammation pain around the second MPJ left with fluid buildup around the joint surface     Assessment:   Inflammatory capsulitis second MPJ left with fluid buildup      Plan:    H&P condition reviewed and at this time proximal nerve block administered aspirated the second MPJ getting out of small amount of clear fluid and injected with a quarter cc dexamethasone Kenalog to take pressure off the joint and applied padding to reduce all stress. Patient will be seen back to recheck and ultimately may require shortening osteotomy  X-ray indicates there is elongation second metatarsal second digit complex left with no indication of arthritis stress fracture

## 2017-07-21 ENCOUNTER — Telehealth: Payer: Self-pay | Admitting: Obstetrics & Gynecology

## 2017-07-21 NOTE — Telephone Encounter (Signed)
Patient checking on a health screen form that she dropped off last week for Dr Sabra Heck to fill out.

## 2017-07-21 NOTE — Telephone Encounter (Signed)
Spoke with patient, advised completed form is ready for pick up. Patient will pick up this afternoon. Patient thankful for assistance.  Form completed by Dr. Talbert Nan, last AEX with Kem Boroughs, NP 05/19/17.  Routing to provider for final review. Patient is agreeable to disposition. Will close encounter.

## 2017-07-31 ENCOUNTER — Encounter: Payer: Self-pay | Admitting: Podiatry

## 2017-07-31 ENCOUNTER — Ambulatory Visit (INDEPENDENT_AMBULATORY_CARE_PROVIDER_SITE_OTHER): Payer: BLUE CROSS/BLUE SHIELD | Admitting: Podiatry

## 2017-07-31 DIAGNOSIS — M779 Enthesopathy, unspecified: Secondary | ICD-10-CM | POA: Diagnosis not present

## 2017-07-31 NOTE — Progress Notes (Signed)
Subjective:    Patient ID: Alyssa Reynolds, female   DOB: 51 y.o.   MRN: [id number]   HPI patient presents stating that the joint feels a lot better and I know I need new orthotics    ROS      Objective:  Physical Exam neurovascular status intact with patient second MPJ left showing improvement of previous inflammatory capsulitis with diminished discomfort upon palpation but prominence of the metatarsophalangeal joint     Assessment:    Chronic capsulitis second MPJ bilateral with previous surgery on the right     Plan:   H&P discussed continued padding rigid bottom shoes and scheduled with Liliane Channel for orthotic treatment to offload the second MPJ bilateral and reduce pressure against the joint surface. She has an existing pair which may be utilized is a second pair but will require modification to the left orthotic

## 2017-08-06 ENCOUNTER — Ambulatory Visit (INDEPENDENT_AMBULATORY_CARE_PROVIDER_SITE_OTHER): Payer: BLUE CROSS/BLUE SHIELD | Admitting: Orthotics

## 2017-08-06 DIAGNOSIS — M779 Enthesopathy, unspecified: Secondary | ICD-10-CM

## 2017-08-06 DIAGNOSIS — M79672 Pain in left foot: Secondary | ICD-10-CM

## 2017-08-06 NOTE — Progress Notes (Signed)
Patient came in today for F/O per dr. Paulla Dolly.  Patient presents with bilateral # 2 capsulitis MPJ.   F/O based on previous scan Richy w/ bilat #2 offloads.

## 2017-08-13 ENCOUNTER — Other Ambulatory Visit: Payer: Self-pay

## 2017-08-13 NOTE — Telephone Encounter (Signed)
Opened in error

## 2017-08-20 ENCOUNTER — Other Ambulatory Visit: Payer: Self-pay | Admitting: Obstetrics and Gynecology

## 2017-08-20 MED ORDER — SUMATRIPTAN SUCCINATE 100 MG PO TABS: ORAL_TABLET | ORAL | 2 refills | Status: DC

## 2017-08-20 MED ORDER — ETONOGESTREL-ETHINYL ESTRADIOL 0.12-0.015 MG/24HR VA RING: VAGINAL_RING | VAGINAL | 2 refills | Status: DC

## 2017-08-20 MED ORDER — SERTRALINE HCL 100 MG PO TABS: 100.0000 mg | ORAL_TABLET | Freq: Every day | ORAL | 2 refills | Status: DC

## 2017-08-20 NOTE — Telephone Encounter (Addendum)
Spoke with patient, called to clarify request as seen below. Patient states prescriptions have been transferred to [facility].  Patient scheduled for next AEX 09/15/18 with Alyssa Reynolds, request earlier AEX, last AEX 05/19/17 with Alyssa Boroughs, NP.   AEX scheduled for 06/05/18 at 9am with Alyssa Reynolds.   RX for Sertraline, Nuvaring , Imatrex previously written for 90 day supply 05/19/17  Advised patient RN would return call to [facility] and return call once completed.    Spoke with Alyssa Reynolds at [facility], request new order for 90 day supply of Sumatriptan, Sertraline, and Nuvaring. Was advised request sent to office 08/13/17. Advised Alyssa Reynolds new order will be placed electronically.  New RX placed for: Sumatriptan #27/ 2RF,Sertraline #90/2RF, Nuvaring #3/ 2RF to [facility].   Routing to covering provider for review.  Cc: Alyssa Reynolds

## 2017-08-20 NOTE — Telephone Encounter (Signed)
[facility] calling to get authorization for 90 day supply for patient's medication. Prescriptions are for Sertraline HCL 100mg  tabs, Sumatriptan 100mg  tabs and Nuvaring. Phone number is [phone].

## 2017-08-21 NOTE — Telephone Encounter (Signed)
Call to patient to notify prescriptions sent to [facility] as requested. Unable to leave message, mailbox full.

## 2017-08-22 NOTE — Telephone Encounter (Signed)
Given the patient's age, I would recommend that she get an [facility] level at the end of the week without the nuvaring. She may be menopausal and not need the ring.

## 2017-08-22 NOTE — Telephone Encounter (Signed)
Spoke with patient, advised as seen below per Dr. Talbert Nan. Patient declined to schedule at this time, she would like to think about it and return call. Advised patient requested prescriptions to [facility] on 08/20/17. Patient verbalizes understanding.   Routing to provider for final review. Patient is agreeable to disposition. Will close encounter.

## 2017-08-27 ENCOUNTER — Other Ambulatory Visit: Payer: BLUE CROSS/BLUE SHIELD | Admitting: Orthotics

## 2017-09-11 ENCOUNTER — Ambulatory Visit: Payer: BLUE CROSS/BLUE SHIELD | Admitting: Orthotics

## 2017-09-11 DIAGNOSIS — M779 Enthesopathy, unspecified: Secondary | ICD-10-CM

## 2017-09-11 NOTE — Progress Notes (Signed)
Richy failed to offload 2nd MPJ LEFT, LEFT only to be sent back to have MPJ offloaded .

## 2017-09-30 ENCOUNTER — Ambulatory Visit: Payer: BLUE CROSS/BLUE SHIELD | Admitting: Orthotics

## 2017-09-30 DIAGNOSIS — M79672 Pain in left foot: Secondary | ICD-10-CM

## 2017-09-30 DIAGNOSIS — M779 Enthesopathy, unspecified: Secondary | ICD-10-CM

## 2017-09-30 NOTE — Progress Notes (Signed)
Patient picked up adjusted foot orthotics (dispersion pad for 2 met L).  Seemed pleased.

## 2018-03-11 ENCOUNTER — Other Ambulatory Visit: Payer: Self-pay | Admitting: Obstetrics & Gynecology

## 2018-03-11 DIAGNOSIS — Z1231 Encounter for screening mammogram for malignant neoplasm of breast: Secondary | ICD-10-CM

## 2018-03-19 ENCOUNTER — Ambulatory Visit (INDEPENDENT_AMBULATORY_CARE_PROVIDER_SITE_OTHER): Payer: BLUE CROSS/BLUE SHIELD

## 2018-03-19 ENCOUNTER — Ambulatory Visit (INDEPENDENT_AMBULATORY_CARE_PROVIDER_SITE_OTHER): Payer: BLUE CROSS/BLUE SHIELD | Admitting: Podiatry

## 2018-03-19 ENCOUNTER — Other Ambulatory Visit: Payer: Self-pay | Admitting: Podiatry

## 2018-03-19 ENCOUNTER — Encounter: Payer: Self-pay | Admitting: Podiatry

## 2018-03-19 DIAGNOSIS — M79672 Pain in left foot: Secondary | ICD-10-CM

## 2018-03-19 DIAGNOSIS — M779 Enthesopathy, unspecified: Secondary | ICD-10-CM | POA: Diagnosis not present

## 2018-03-19 NOTE — Patient Instructions (Signed)
Pre-Operative Instructions  Congratulations, you have decided to take an important step towards improving your quality of life.  You can be assured that the doctors and staff at [facility] will be with you every step of the way.  Here are some important things you should know:  1. Plan to be at the surgery center/hospital at least 1 (one) hour prior to your scheduled time, unless otherwise directed by the surgical center/hospital staff.  You must have a responsible adult accompany you, remain during the surgery and drive you home.  Make sure you have directions to the surgical center/hospital to ensure you arrive on time. 2. If you are having surgery at [facility] or [facility], you will need a copy of your medical history and physical form from your family physician within one month prior to the date of surgery. We will give you a form for your primary physician to complete.  3. We make every effort to accommodate the date you request for surgery.  However, there are times where surgery dates or times have to be moved.  We will contact you as soon as possible if a change in schedule is required.   4. No aspirin/ibuprofen for one week before surgery.  If you are on aspirin, any non-steroidal anti-inflammatory medications (Mobic, Aleve, Ibuprofen) should not be taken seven (7) days prior to your surgery.  You make take Tylenol for pain prior to surgery.  5. Medications - If you are taking daily heart and blood pressure medications, seizure, reflux, allergy, asthma, anxiety, pain or diabetes medications, make sure you notify the surgery center/hospital before the day of surgery so they can tell you which medications you should take or avoid the day of surgery. 6. No food or drink after midnight the night before surgery unless directed otherwise by surgical center/hospital staff. 7. No alcoholic beverages 24-hours prior to surgery.  No smoking 24-hours prior or 24-hours after  surgery. 8. Wear loose pants or shorts. They should be loose enough to fit over bandages, boots, and casts. 9. Don't wear slip-on shoes. Sneakers are preferred. 10. Bring your boot with you to the surgery center/hospital.  Also bring crutches or a walker if your physician has prescribed it for you.  If you do not have this equipment, it will be provided for you after surgery. 11. If you have not been contacted by the surgery center/hospital by the day before your surgery, call to confirm the date and time of your surgery. 12. Leave-time from work may vary depending on the type of surgery you have.  Appropriate arrangements should be made prior to surgery with your employer. 13. Prescriptions will be provided immediately following surgery by your doctor.  Fill these as soon as possible after surgery and take the medication as directed. Pain medications will not be refilled on weekends and must be approved by the doctor. 14. Remove nail polish on the operative foot and avoid getting pedicures prior to surgery. 15. Wash the night before surgery.  The night before surgery wash the foot and leg well with water and the antibacterial soap provided. Be sure to pay special attention to beneath the toenails and in between the toes.  Wash for at least three (3) minutes. Rinse thoroughly with water and dry well with a towel.  Perform this wash unless told not to do so by your physician.  Enclosed: 1 Ice pack (please put in freezer the night before surgery)   1 Hibiclens skin cleaner     Pre-op instructions  If you have any questions regarding the instructions, please do not hesitate to call our office.  [place]: [address], [place], [place] [address] -- [phone]  [place]: [address] [place], [place] [address] -- [phone]  [place]: [address]  [place], [place] [address] -- [phone]  [place]: [address], [address], [place], [place] [address] -- [phone]  Website:  [email or web address] 

## 2018-03-19 NOTE — Progress Notes (Signed)
Subjective:   Patient ID: Alyssa Reynolds, female   DOB: 52 y.o.   MRN: [id number]   HPI Patient presents today to the left joint has really bothered her that it is making it hard for her to be comfortable.  States that it is been going on for several months and has become worse and she is had a problem with it for years and 1 week based on the right   ROS      Objective:  Physical Exam  Neurovascular status intact with patient's right second MPJ doing very well post surgery with digital effusion in the left showing that the problem is strictly within the joint itself and the digit itself is healthy with quite a bit of inflammation pain in the second MPJ left     Assessment:  Inflammatory capsulitis second MPJ left with pain with healed right from previous surgery     Plan:  H&P condition reviewed and due to the long-term nature of the fact the right did so well I recommended shortening osteotomy left.  Patient wants procedure and I reviewed procedure and went over the procedure and risks and patient is willing to accept risk understanding the surgical intervention.  Patient is scheduled for outpatient surgery after reviewing alternative treatment complications and understands to recovery can take 6 months just because one did so well there is no guarantee the other one will.  X-rays indicate there is an elongated second metatarsal left with second metatarsal right in excellent position with digital fusion also noted second digit right

## 2018-04-07 ENCOUNTER — Encounter: Payer: Self-pay | Admitting: Podiatry

## 2018-04-07 DIAGNOSIS — M216X2 Other acquired deformities of left foot: Secondary | ICD-10-CM | POA: Diagnosis not present

## 2018-04-07 DIAGNOSIS — M21542 Acquired clubfoot, left foot: Secondary | ICD-10-CM | POA: Diagnosis not present

## 2018-04-07 DIAGNOSIS — M2042 Other hammer toe(s) (acquired), left foot: Secondary | ICD-10-CM | POA: Diagnosis not present

## 2018-04-07 DIAGNOSIS — G43909 Migraine, unspecified, not intractable, without status migrainosus: Secondary | ICD-10-CM | POA: Diagnosis not present

## 2018-04-08 ENCOUNTER — Ambulatory Visit: Payer: BLUE CROSS/BLUE SHIELD

## 2018-04-15 ENCOUNTER — Ambulatory Visit (INDEPENDENT_AMBULATORY_CARE_PROVIDER_SITE_OTHER): Payer: BLUE CROSS/BLUE SHIELD

## 2018-04-15 ENCOUNTER — Encounter: Payer: Self-pay | Admitting: Podiatry

## 2018-04-15 ENCOUNTER — Ambulatory Visit (INDEPENDENT_AMBULATORY_CARE_PROVIDER_SITE_OTHER): Payer: Self-pay | Admitting: Podiatry

## 2018-04-15 VITALS — BP 111/69 | HR 68 | Temp 96.7°F

## 2018-04-15 DIAGNOSIS — M779 Enthesopathy, unspecified: Secondary | ICD-10-CM

## 2018-04-15 NOTE — Progress Notes (Signed)
Subjective:   Patient ID: Alyssa Reynolds, female   DOB: 52 y.o.   MRN: [id number]   HPI Patient presents stating doing very well with mild discomfort in the left second joint and able to walk without significant discomfort   ROS      Objective:  Physical Exam  Neurovascular status intact negative Homans sign noted wound edges well coapted second metatarsal left foot     Assessment:  Doing very well post osteotomy second metatarsal left     Plan:  Reviewed condition and reapplied sterile dressing and discussed continued immobilization elevation compression  X-ray indicates the osteotomy is healing well with screw in place in good alignment of the metatarsal

## 2018-04-28 ENCOUNTER — Ambulatory Visit: Payer: BLUE CROSS/BLUE SHIELD

## 2018-04-28 ENCOUNTER — Ambulatory Visit
Admission: RE | Admit: 2018-04-28 | Discharge: 2018-04-28 | Disposition: A | Discharge status: Home / Self Care | Payer: Self-pay | Source: Ambulatory Visit | Attending: Obstetrics & Gynecology | Admitting: Obstetrics & Gynecology

## 2018-04-28 DIAGNOSIS — Z1231 Encounter for screening mammogram for malignant neoplasm of breast: Secondary | ICD-10-CM | POA: Diagnosis not present

## 2018-04-29 ENCOUNTER — Other Ambulatory Visit: Payer: Self-pay

## 2018-04-29 ENCOUNTER — Ambulatory Visit (INDEPENDENT_AMBULATORY_CARE_PROVIDER_SITE_OTHER): Payer: BLUE CROSS/BLUE SHIELD | Admitting: Podiatry

## 2018-04-29 ENCOUNTER — Encounter: Payer: Self-pay | Admitting: Podiatry

## 2018-04-29 ENCOUNTER — Ambulatory Visit (INDEPENDENT_AMBULATORY_CARE_PROVIDER_SITE_OTHER): Payer: BLUE CROSS/BLUE SHIELD

## 2018-04-29 DIAGNOSIS — M779 Enthesopathy, unspecified: Secondary | ICD-10-CM | POA: Diagnosis not present

## 2018-05-03 ENCOUNTER — Other Ambulatory Visit: Payer: Self-pay | Admitting: Obstetrics and Gynecology

## 2018-05-03 NOTE — Progress Notes (Signed)
Subjective:   Patient ID: Alyssa Reynolds, female   DOB: 52 y.o.   MRN: [id number]   HPI Patient states doing well postsurgically   ROS      Objective:  Physical Exam  Neurovascular status intact negative Homans sign noted wound edges well coapted second metatarsal left     Assessment:  Doing very well post shortening osteotomy second metatarsal left     Plan:  Reviewed x-rays advised on increased activities and continued compression elevation immobilization as needed  X-ray indicates osteotomy is healing well fixation in place good alignment noted

## 2018-05-04 NOTE — Telephone Encounter (Signed)
Medication refill request: zoloft  Last AEX:  05/19/17 PG  Next AEX: 06/05/18 SM  Last MMG (if hormonal medication request): 04/28/18 BIRADS 1 negative  Refill authorized: 08/20/17 #90, 2RF. Today, please advise.

## 2018-05-22 ENCOUNTER — Ambulatory Visit: Payer: BLUE CROSS/BLUE SHIELD | Admitting: Nurse Practitioner

## 2018-06-01 ENCOUNTER — Ambulatory Visit (INDEPENDENT_AMBULATORY_CARE_PROVIDER_SITE_OTHER): Payer: Self-pay | Admitting: Podiatry

## 2018-06-01 ENCOUNTER — Ambulatory Visit (INDEPENDENT_AMBULATORY_CARE_PROVIDER_SITE_OTHER): Payer: BLUE CROSS/BLUE SHIELD

## 2018-06-01 DIAGNOSIS — M779 Enthesopathy, unspecified: Secondary | ICD-10-CM

## 2018-06-01 DIAGNOSIS — M79672 Pain in left foot: Secondary | ICD-10-CM

## 2018-06-03 NOTE — Progress Notes (Signed)
Subjective: Alyssa Reynolds is a 52 y.o. is seen today in office s/p left second metatarsal osteotomy preformed on 04/07/2018 with Dr. Paulla Dolly. They state their pain is well controlled.  She is been wearing sandals or lose her style shoe without any problems.  She is not quite been able to get into a regular shoe but she states is not having any pain to the area and she is very happy with the surgery.  Denies any systemic complaints such as fevers, chills, nausea, vomiting. No calf pain, chest pain, shortness of breath.   Objective: General: No acute distress, AAOx3  DP/PT pulses palpable 2/4, CRT < 3 sec to all digits.  Protective sensation intact. Motor function intact.  Left foot: Incision is well coapted without any evidence of dehiscence and the scar is well formed. There is no surrounding erythema, ascending cellulitis, fluctuance, crepitus, malodor, drainage/purulence. There is no significant edema around the surgical site. There is no pain along the surgical site.  No pain in the surgical site. No other areas of tenderness to bilateral lower extremities.  No other open lesions or pre-ulcerative lesions.  No pain with calf compression, swelling, warmth, erythema.   Assessment and Plan:  Status post left second metatarsal osteotomy, doing well with no complications   -Treatment options discussed including all alternatives, risks, and complications -X-rays were obtained reviewed.  Hardware intact.  Evidence of increased healing to the surgical site there is no evidence of acute fracture identified otherwise. -At this point gradually increase activity level and return to regular shoe gear and activities.  Continue ice the area after activity. -Ice/elevation -Monitor for any clinical signs or symptoms of infection and DVT/PE and directed to call the office immediately should any occur or go to the ER. -Follow-up as scheduled with Dr. Paulla Dolly or sooner if any problems arise. In the meantime,  encouraged to call the office with any questions, concerns, change in symptoms.   Celesta Gentile, DPM

## 2018-06-05 ENCOUNTER — Encounter: Payer: Self-pay | Admitting: Obstetrics & Gynecology

## 2018-06-05 ENCOUNTER — Other Ambulatory Visit: Payer: Self-pay

## 2018-06-05 ENCOUNTER — Ambulatory Visit (INDEPENDENT_AMBULATORY_CARE_PROVIDER_SITE_OTHER): Payer: BLUE CROSS/BLUE SHIELD | Admitting: Obstetrics & Gynecology

## 2018-06-05 VITALS — BP 108/64 | HR 78 | Temp 98.1°F | Resp 16 | Ht 68.5 in | Wt 124.0 lb

## 2018-06-05 DIAGNOSIS — E559 Vitamin D deficiency, unspecified: Secondary | ICD-10-CM | POA: Diagnosis not present

## 2018-06-05 DIAGNOSIS — Z0000 Encounter for general adult medical examination without abnormal findings: Secondary | ICD-10-CM

## 2018-06-05 DIAGNOSIS — Z01419 Encounter for gynecological examination (general) (routine) without abnormal findings: Secondary | ICD-10-CM

## 2018-06-05 DIAGNOSIS — G43009 Migraine without aura, not intractable, without status migrainosus: Secondary | ICD-10-CM

## 2018-06-05 MED ORDER — SUMATRIPTAN SUCCINATE 100 MG PO TABS: ORAL_TABLET | ORAL | 4 refills | Status: DC

## 2018-06-05 NOTE — Progress Notes (Signed)
52 y.o. [id number] MarriedCaucasianF here for annual exam.  H/O chronic migraines that are cycle related.  Headaches have decreased in frequency over the past year.  Did try Nuva ring last year but this just made her feel "hormonal" and she stopped this.  Cycles are more irregular this year.  Husband has vasectomy so does not need contraception.   Patient's last menstrual period was 05/21/2018.          Sexually active: yes      The current method of family planning is vasectomy.    Exercising:  no Smoker:  no  Health Maintenance: Pap:  05/19/17 Neg. HR HPV:neg   05/15/16 Neg. HR HPV:neg  History of abnormal Pap:  no MMG:  04/28/18 BIRADS1:Neg  Colonoscopy:  09/20/15 Polyp. F/u 5 years  BMD:   1998, heel test, normal TDaP:  2013 Pneumonia vaccine(s): no Shingrix:   no Hep C testing: not indicated Screening Labs: discuss today    reports that she has never smoked. She has never used smokeless tobacco. She reports that she drinks about 2.4 oz of alcohol per week. She reports that she does not use drugs.  Past Medical History:  Diagnosis Date  . Depression   . 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 Infertility, female    took clomid  . Migraines    w/o aura  . Situational anxiety     Past Surgical History:  Procedure Laterality Date  . AUGMENTATION MAMMAPLASTY Bilateral   . BREAST ENHANCEMENT SURGERY Bilateral 01/2007   implants  . BREAST ENHANCEMENT SURGERY Bilateral 5/09   reconstruction/replacement breast implants  . CESAREAN SECTION  3/98  . FOOT SURGERY Right 12/2014  . NEUROMA SURGERY      Current Outpatient Medications  Medication Sig Dispense Refill  . acetaminophen (TYLENOL) 325 MG tablet Take 650 mg by mouth every 6 (six) hours as needed.     . ASHWAGANDHA PO Take by mouth daily.    . Cholecalciferol (VITAMIN D) 1000 UNITS capsule Take 1,000 Units by mouth daily.    . hydrOXYzine (ATARAX/VISTARIL) 10 MG tablet Take 1 tablet (10 mg total) by  mouth at bedtime as needed. 90 tablet 4  . hydrOXYzine (VISTARIL) 25 MG capsule Take 1 capsule (25 mg total) by mouth every 6 (six) hours as needed. 30 capsule 0  . ibuprofen (ADVIL,MOTRIN) 200 MG tablet Take 200 mg by mouth every 6 (six) hours as needed.    Marland Kitchen levocetirizine (XYZAL ALLERGY 24HR) 5 MG tablet Take 5 mg by mouth every evening.    . sertraline (ZOLOFT) 100 MG tablet TAKE 1 TABLET DAILY 90 tablet 0  . SUMAtriptan (IMITREX) 100 MG tablet Use as directed for headaches with 1 at onset and repeat in 2 hrs for a max of 2 tablets in 24 hours. 27 tablet 2   No current facility-administered medications for this visit.     Family History  Problem Relation Age of Onset  . Breast cancer Maternal Grandmother 19  . Ovarian cancer Maternal Grandmother 91  . Melanoma Paternal Grandfather   . Hypertension Paternal Grandfather   . Heart attack Father        CABG  . Transient ischemic attack Father   . Melanoma Father   . Osteoarthritis Mother   . Melanoma Maternal Aunt   . Rheum arthritis Maternal Aunt   . Hypertension Maternal Grandfather   . Melanoma Maternal Grandfather   . Stroke Paternal Grandmother   .  Osteoporosis Paternal Grandmother   . Colon polyps Brother        father , brother,  PGU X 2    Review of Systems  Genitourinary:       MENSTRUAL CYCLE CHANGE  Neurological: Positive for headaches.  All other systems reviewed and are negative.   Exam:   BP 108/64   Pulse 78   Temp 98.1 F (36.7 C)   Resp 16   Ht 5' 8.5" (1.74 m)   Wt 124 lb (56.2 kg)   LMP 05/21/2018   BMI 18.58 kg/m     Height: 5' 8.5" (174 cm)  Ht Readings from Last 3 Encounters:  06/05/18 5' 8.5" (1.74 m)  05/19/17 5' 8.5" (1.74 m)  05/15/16 5' 8.75" (1.746 m)    General appearance: alert, cooperative and appears stated age Head: Normocephalic, without obvious abnormality, atraumatic Neck: no adenopathy, supple, symmetrical, trachea midline and thyroid normal to inspection and  palpation Lungs: clear to auscultation bilaterally Breasts: normal appearance, no masses or tenderness, bilateral implants Heart: regular rate and rhythm Abdomen: soft, non-tender; bowel sounds normal; no masses,  no organomegaly Extremities: extremities normal, atraumatic, no cyanosis or edema Skin: Skin color, texture, turgor normal. No rashes or lesions Lymph nodes: Cervical, supraclavicular, and axillary nodes normal. No abnormal inguinal nodes palpated Neurologic: Grossly normal   Pelvic: External genitalia:  no lesions              Urethra:  normal appearing urethra with no masses, tenderness or lesions              Bartholins and Skenes: normal                 Vagina: normal appearing vagina with normal color and discharge, no lesions              Cervix: no lesions              Pap taken: No. Bimanual Exam:  Uterus:  normal size, contour, position, consistency, mobility, non-tender              Adnexa: normal adnexa and no mass, fullness, tenderness               Rectovaginal: Confirms               Anus:  normal sphincter tone, no lesions  Chaperone was present for exam.  A:  Well Woman with normal exam Perimenopausal bleeding H/O situational anxiety and depression H/O migraines w/o aura Family hx of colon polyps  P:   Mammogram guidelines reviewed.  Doing 3D MMG. pap smear not indicated Zoloft rx not needed right now.  Considering weaning as she is currently taking 50mg  daily.  D/w pt decreasing to 25mg  daily and then decreasing further.  Information provided on specifically how to do this Lab work obtained.  Pt needs for insurance purposes yearly:  CBC, CMP, Lipids, TSH, Vit D RF for Imitrex 100mg  at headache onset, repeat in 2 hours if needed.  200mg  max/24 hours reviewed.  #27/4RF return annually or prn

## 2018-06-06 LAB — CBC
HEMATOCRIT: 39 % (ref 34.0–46.6)
HEMOGLOBIN: 12.7 g/dL (ref 11.1–15.9)
MCH: 30 pg (ref 26.6–33.0)
MCHC: 32.6 g/dL (ref 31.5–35.7)
MCV: 92 fL (ref 79–97)
Platelets: 188 10*3/uL (ref 150–450)
RBC: 4.23 x10E6/uL (ref 3.77–5.28)
RDW: 13.1 % (ref 12.3–15.4)
WBC: 4 10*3/uL (ref 3.4–10.8)

## 2018-06-06 LAB — COMPREHENSIVE METABOLIC PANEL
A/G RATIO: 2.3 — AB (ref 1.2–2.2)
ALT: 5 IU/L (ref 0–32)
AST: 14 IU/L (ref 0–40)
Albumin: 4.5 g/dL (ref 3.5–5.5)
Alkaline Phosphatase: 46 IU/L (ref 39–117)
BUN/Creatinine Ratio: 18 (ref 9–23)
BUN: 11 mg/dL (ref 6–24)
Bilirubin Total: 0.5 mg/dL (ref 0.0–1.2)
CALCIUM: 9.5 mg/dL (ref 8.7–10.2)
CHLORIDE: 101 mmol/L (ref 96–106)
CO2: 25 mmol/L (ref 20–29)
Creatinine, Ser: 0.61 mg/dL (ref 0.57–1.00)
GFR, EST AFRICAN AMERICAN: 121 mL/min/{1.73_m2} (ref >59)
GFR, EST NON AFRICAN AMERICAN: 105 mL/min/{1.73_m2} (ref >59)
Globulin, Total: 2 g/dL (ref 1.5–4.5)
Glucose: 89 mg/dL (ref 65–99)
POTASSIUM: 4.8 mmol/L (ref 3.5–5.2)
Sodium: 140 mmol/L (ref 134–144)
Total Protein: 6.5 g/dL (ref 6.0–8.5)

## 2018-06-06 LAB — TSH: TSH: 0.923 u[IU]/mL (ref 0.450–4.500)

## 2018-06-06 LAB — VITAMIN D 25 HYDROXY (VIT D DEFICIENCY, FRACTURES): VIT D 25 HYDROXY: 23.8 ng/mL — AB (ref 30.0–100.0)

## 2018-06-06 LAB — LIPID PANEL
CHOL/HDL RATIO: 2.5 ratio (ref 0.0–4.4)
Cholesterol, Total: 181 mg/dL (ref 100–199)
HDL: 71 mg/dL (ref >39)
LDL CALC: 90 mg/dL (ref 0–99)
TRIGLYCERIDES: 101 mg/dL (ref 0–149)
VLDL Cholesterol Cal: 20 mg/dL (ref 5–40)

## 2018-06-08 ENCOUNTER — Encounter: Payer: Self-pay | Admitting: Obstetrics & Gynecology

## 2018-06-08 DIAGNOSIS — G43009 Migraine without aura, not intractable, without status migrainosus: Secondary | ICD-10-CM | POA: Insufficient documentation

## 2018-07-06 ENCOUNTER — Encounter: Payer: BLUE CROSS/BLUE SHIELD | Admitting: Podiatry

## 2018-08-21 ENCOUNTER — Other Ambulatory Visit: Payer: BLUE CROSS/BLUE SHIELD

## 2018-09-15 ENCOUNTER — Ambulatory Visit: Payer: BLUE CROSS/BLUE SHIELD | Admitting: Obstetrics & Gynecology

## 2019-02-23 ENCOUNTER — Other Ambulatory Visit: Payer: Self-pay | Admitting: Obstetrics & Gynecology

## 2019-02-23 MED ORDER — SUMATRIPTAN SUCCINATE 100 MG PO TABS: ORAL_TABLET | ORAL | 0 refills | Status: DC

## 2019-02-23 NOTE — Telephone Encounter (Signed)
Patient requesting a 90 day supply refill on Imitrex. A[facility] on [address]. [phone]

## 2019-02-23 NOTE — Telephone Encounter (Signed)
Medication refill request: Imitrex  Last AEX:  06/05/18 SM Next AEX: 06/18/19  Last MMG (if hormonal medication request): 04/28/18 BIRADS1:Neg  Refill authorized: 06/05/18 #27/4R. Today #27/0R?

## 2019-04-01 DIAGNOSIS — C44722 Squamous cell carcinoma of skin of right lower limb, including hip: Secondary | ICD-10-CM | POA: Diagnosis not present

## 2019-04-29 ENCOUNTER — Encounter: Payer: Self-pay | Admitting: Family Medicine

## 2019-06-15 DIAGNOSIS — Z85828 Personal history of other malignant neoplasm of skin: Secondary | ICD-10-CM | POA: Diagnosis not present

## 2019-06-15 DIAGNOSIS — L814 Other melanin hyperpigmentation: Secondary | ICD-10-CM | POA: Diagnosis not present

## 2019-06-15 DIAGNOSIS — D225 Melanocytic nevi of trunk: Secondary | ICD-10-CM | POA: Diagnosis not present

## 2019-06-15 DIAGNOSIS — D2261 Melanocytic nevi of right upper limb, including shoulder: Secondary | ICD-10-CM | POA: Diagnosis not present

## 2019-06-18 ENCOUNTER — Other Ambulatory Visit: Payer: Self-pay

## 2019-06-18 ENCOUNTER — Encounter: Payer: Self-pay | Admitting: Obstetrics & Gynecology

## 2019-06-18 ENCOUNTER — Ambulatory Visit (INDEPENDENT_AMBULATORY_CARE_PROVIDER_SITE_OTHER): Payer: BC Managed Care – PPO | Admitting: Obstetrics & Gynecology

## 2019-06-18 ENCOUNTER — Other Ambulatory Visit (INDEPENDENT_AMBULATORY_CARE_PROVIDER_SITE_OTHER): Payer: BC Managed Care – PPO

## 2019-06-18 VITALS — BP 102/64 | HR 76 | Temp 97.3°F | Ht 68.5 in | Wt 127.2 lb

## 2019-06-18 DIAGNOSIS — Z0000 Encounter for general adult medical examination without abnormal findings: Secondary | ICD-10-CM

## 2019-06-18 DIAGNOSIS — E559 Vitamin D deficiency, unspecified: Secondary | ICD-10-CM | POA: Diagnosis not present

## 2019-06-18 DIAGNOSIS — Z01419 Encounter for gynecological examination (general) (routine) without abnormal findings: Secondary | ICD-10-CM

## 2019-06-18 MED ORDER — SUMATRIPTAN SUCCINATE 100 MG PO TABS: ORAL_TABLET | ORAL | 4 refills | Status: DC

## 2019-06-18 NOTE — Progress Notes (Signed)
53 y.o. G66P2002 Married White or Caucasian female here for annual exam.  Cycles have been sporadic over the past year but the past few have been more regular.  Headaches have increased.    Patient's last menstrual period was 06/12/2019.          Sexually active: Yes.    The current method of family planning is vasectomy.    Exercising: Yes. Yoga, weights, walking Smoker:  no  Health Maintenance: Pap:  05/19/17 Neg. HR HPV:neg   05/15/16 Neg. HR HPV:neg  History of abnormal Pap:  no MMG:  04/28/18 BIRADS1:neg.  She is aware this is due.   Colonoscopy:  09/20/15 f/u 5 years  BMD:   1998 heel test  TDaP:  2013 Pneumonia vaccine(s):  n/a Shingrix:   No Hep C testing: n/a  Screening Labs: fasting labs drawn this morning    reports that she has never smoked. She has never used smokeless tobacco. She reports current alcohol use of about 4.0 standard drinks of alcohol per week. She reports that she does not use drugs.  Past Medical History:  Diagnosis Date  . Depression   . 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 Infertility, female    took clomid  . Migraines    w/o aura  . Situational anxiety     Past Surgical History:  Procedure Laterality Date  . BREAST ENHANCEMENT SURGERY Bilateral 01/2007   implants  . BREAST ENHANCEMENT SURGERY Bilateral 5/09   reconstruction/replacement breast implants  . CESAREAN SECTION  3/98  . FOOT SURGERY Bilateral 2015   2015, 2019  . NEUROMA SURGERY Right 12/2014    Current Outpatient Medications  Medication Sig Dispense Refill  . acetaminophen (TYLENOL) 325 MG tablet Take 650 mg by mouth every 6 (six) hours as needed.     . ASHWAGANDHA PO Take by mouth daily.    . Cholecalciferol (VITAMIN D) 1000 UNITS capsule Take 1,000 Units by mouth daily.    Marland Kitchen ibuprofen (ADVIL,MOTRIN) 200 MG tablet Take 200 mg by mouth every 6 (six) hours as needed.    . sertraline (ZOLOFT) 100 MG tablet TAKE 1 TABLET DAILY (Patient taking differently:  Take 50 mg by mouth daily. ) 90 tablet 0  . SUMAtriptan (IMITREX) 100 MG tablet Use as directed for headaches with 1 at onset and repeat in 2 hrs for a max of 2 tablets in 24 hours. 27 tablet 0   No current facility-administered medications for this visit.     Family History  Problem Relation Age of Onset  . Breast cancer Maternal Grandmother 68  . Ovarian cancer Maternal Grandmother 91  . Melanoma Paternal Grandfather   . Hypertension Paternal Grandfather   . Heart attack Father        CABG  . Transient ischemic attack Father   . Melanoma Father   . Osteoarthritis Mother   . Melanoma Maternal Aunt   . Rheum arthritis Maternal Aunt   . Hypertension Maternal Grandfather   . Melanoma Maternal Grandfather   . Stroke Paternal Grandmother   . Osteoporosis Paternal Grandmother   . Colon polyps Brother        father , brother,  PGU X 2    Review of Systems  All other systems reviewed and are negative.   Exam:   BP 102/64   Pulse 76   Temp (!) 97.3 F (36.3 C) (Temporal)   Ht 5' 8.5" (1.74 m)   Wt 127  lb 3.2 oz (57.7 kg)   LMP 06/12/2019   BMI 19.06 kg/m   Height: 5' 8.5" (174 cm)  Ht Readings from Last 3 Encounters:  06/18/19 5' 8.5" (1.74 m)  06/05/18 5' 8.5" (1.74 m)  05/19/17 5' 8.5" (1.74 m)    General appearance: alert, cooperative and appears stated age Head: Normocephalic, without obvious abnormality, atraumatic Neck: no adenopathy, supple, symmetrical, trachea midline and thyroid normal to inspection and palpation Lungs: clear to auscultation bilaterally Breasts: normal appearance, no masses or tenderness Heart: regular rate and rhythm Abdomen: soft, non-tender; bowel sounds normal; no masses,  no organomegaly Extremities: extremities normal, atraumatic, no cyanosis or edema Skin: Skin color, texture, turgor normal. No rashes or lesions Lymph nodes: Cervical, supraclavicular, and axillary nodes normal. No abnormal inguinal nodes palpated Neurologic: Grossly  normal   Pelvic: External genitalia:  no lesions              Urethra:  normal appearing urethra with no masses, tenderness or lesions              Bartholins and Skenes: normal                 Vagina: normal appearing vagina with normal color and discharge, no lesions              Cervix: no lesions              Pap taken: No. Bimanual Exam:  Uterus:  normal size, contour, position, consistency, mobility, non-tender              Adnexa: normal adnexa and no mass, fullness, tenderness               Rectovaginal: Confirms               Anus:  normal sphincter tone, no lesions  Chaperone was present for exam.  A:  Well Woman with normal exam Perimenopausal bleeding H/o situation anxiety and depression H/o migraines w/o aura Family hx of colon polyps  P:   Mammogram guidelines reviewed.  Doing 3D MMG.  She is aware this is due.  pap smear with neg HR HPV 2018.  Not indicated today. Zoloft rx not needed at this time.  Taking 50mg  daily.   RF for Imitrex 100mg  at headache onset, repeat in 2 hours if needed.  200mg  max/24 hours.  #27/4RF Had lab work earlier this morning Return annually or prn

## 2019-06-19 LAB — CBC
Hematocrit: 38.2 % (ref 34.0–46.6)
Hemoglobin: 12.4 g/dL (ref 11.1–15.9)
MCH: 30.5 pg (ref 26.6–33.0)
MCHC: 32.5 g/dL (ref 31.5–35.7)
MCV: 94 fL (ref 79–97)
Platelets: 186 10*3/uL (ref 150–450)
RBC: 4.06 x10E6/uL (ref 3.77–5.28)
RDW: 12.2 % (ref 11.7–15.4)
WBC: 3.5 10*3/uL (ref 3.4–10.8)

## 2019-06-19 LAB — COMPREHENSIVE METABOLIC PANEL
ALT: 8 IU/L (ref 0–32)
AST: 15 IU/L (ref 0–40)
Albumin/Globulin Ratio: 2.7 — ABNORMAL HIGH (ref 1.2–2.2)
Albumin: 4.8 g/dL (ref 3.8–4.9)
Alkaline Phosphatase: 43 IU/L (ref 39–117)
BUN/Creatinine Ratio: 20 (ref 9–23)
BUN: 12 mg/dL (ref 6–24)
Bilirubin Total: 0.4 mg/dL (ref 0.0–1.2)
CO2: 23 mmol/L (ref 20–29)
Calcium: 9.1 mg/dL (ref 8.7–10.2)
Chloride: 101 mmol/L (ref 96–106)
Creatinine, Ser: 0.59 mg/dL (ref 0.57–1.00)
GFR calc Af Amer: 121 mL/min/{1.73_m2} (ref >59)
GFR calc non Af Amer: 105 mL/min/{1.73_m2} (ref >59)
Globulin, Total: 1.8 g/dL (ref 1.5–4.5)
Glucose: 86 mg/dL (ref 65–99)
Potassium: 4.3 mmol/L (ref 3.5–5.2)
Sodium: 140 mmol/L (ref 134–144)
Total Protein: 6.6 g/dL (ref 6.0–8.5)

## 2019-06-19 LAB — TSH: TSH: 1.55 u[IU]/mL (ref 0.450–4.500)

## 2019-06-19 LAB — LIPID PANEL
Chol/HDL Ratio: 2.4 ratio (ref 0.0–4.4)
Cholesterol, Total: 176 mg/dL (ref 100–199)
HDL: 72 mg/dL (ref >39)
LDL Calculated: 87 mg/dL (ref 0–99)
Triglycerides: 84 mg/dL (ref 0–149)
VLDL Cholesterol Cal: 17 mg/dL (ref 5–40)

## 2019-06-19 LAB — VITAMIN D 25 HYDROXY (VIT D DEFICIENCY, FRACTURES): Vit D, 25-Hydroxy: 29.9 ng/mL — ABNORMAL LOW (ref 30.0–100.0)

## 2019-06-24 ENCOUNTER — Other Ambulatory Visit: Payer: Self-pay

## 2019-06-24 DIAGNOSIS — R6889 Other general symptoms and signs: Secondary | ICD-10-CM | POA: Diagnosis not present

## 2019-06-24 DIAGNOSIS — Z20822 Contact with and (suspected) exposure to covid-19: Secondary | ICD-10-CM

## 2019-06-26 LAB — NOVEL CORONAVIRUS, NAA: SARS-CoV-2, NAA: NOT DETECTED

## 2019-09-13 ENCOUNTER — Other Ambulatory Visit: Payer: Self-pay

## 2019-09-13 ENCOUNTER — Other Ambulatory Visit (INDEPENDENT_AMBULATORY_CARE_PROVIDER_SITE_OTHER): Payer: BC Managed Care – PPO

## 2019-09-13 DIAGNOSIS — Z23 Encounter for immunization: Secondary | ICD-10-CM

## 2019-09-17 ENCOUNTER — Encounter

## 2019-09-17 ENCOUNTER — Ambulatory Visit: Payer: BLUE CROSS/BLUE SHIELD | Admitting: Obstetrics & Gynecology

## 2019-10-27 ENCOUNTER — Other Ambulatory Visit: Payer: Self-pay | Admitting: Obstetrics & Gynecology

## 2019-10-27 NOTE — Telephone Encounter (Signed)
Medication refill request: zoloft Last AEX:  06-18-2019 SM  Next AEX: not currently scheduled  Last MMG (if hormonal medication request): n/a  Refill authorized: Today, please advise.   Medication pended for #90, 0RF. Please refill if appropriate.

## 2019-10-27 NOTE — Telephone Encounter (Signed)
Patient is asking for a refill of sertraline to [facility], [address]

## 2019-10-28 MED ORDER — SERTRALINE HCL 100 MG PO TABS: 50.0000 mg | ORAL_TABLET | Freq: Every day | ORAL | 0 refills | Status: DC

## 2019-10-28 NOTE — Telephone Encounter (Signed)
Spoke with pt. Pt aware of approval of med request.   Will close encounter.

## 2019-10-28 NOTE — Telephone Encounter (Signed)
Patient checking status of refill request. °

## 2019-12-24 DIAGNOSIS — Z85828 Personal history of other malignant neoplasm of skin: Secondary | ICD-10-CM | POA: Diagnosis not present

## 2019-12-24 DIAGNOSIS — L438 Other lichen planus: Secondary | ICD-10-CM | POA: Diagnosis not present

## 2020-03-22 DIAGNOSIS — Z85828 Personal history of other malignant neoplasm of skin: Secondary | ICD-10-CM | POA: Diagnosis not present

## 2020-03-22 DIAGNOSIS — M7130 Other bursal cyst, unspecified site: Secondary | ICD-10-CM | POA: Diagnosis not present

## 2020-03-30 DIAGNOSIS — M67441 Ganglion, right hand: Secondary | ICD-10-CM | POA: Diagnosis not present

## 2020-03-30 DIAGNOSIS — M67449 Ganglion, unspecified hand: Secondary | ICD-10-CM | POA: Diagnosis not present

## 2020-04-03 ENCOUNTER — Other Ambulatory Visit: Payer: Self-pay | Admitting: Family Medicine

## 2020-04-03 DIAGNOSIS — Z1231 Encounter for screening mammogram for malignant neoplasm of breast: Secondary | ICD-10-CM

## 2020-04-17 DIAGNOSIS — M25841 Other specified joint disorders, right hand: Secondary | ICD-10-CM | POA: Diagnosis not present

## 2020-04-17 DIAGNOSIS — M67441 Ganglion, right hand: Secondary | ICD-10-CM | POA: Diagnosis not present

## 2020-04-17 DIAGNOSIS — M71341 Other bursal cyst, right hand: Secondary | ICD-10-CM | POA: Diagnosis not present

## 2020-05-11 ENCOUNTER — Ambulatory Visit
Admission: RE | Admit: 2020-05-11 | Discharge: 2020-05-11 | Disposition: A | Discharge status: Home / Self Care | Payer: BC Managed Care – PPO | Source: Ambulatory Visit | Attending: Family Medicine | Admitting: Family Medicine

## 2020-05-11 ENCOUNTER — Other Ambulatory Visit: Payer: Self-pay

## 2020-05-11 ENCOUNTER — Other Ambulatory Visit: Payer: Self-pay | Admitting: Family Medicine

## 2020-05-11 DIAGNOSIS — Z1231 Encounter for screening mammogram for malignant neoplasm of breast: Secondary | ICD-10-CM

## 2020-08-09 ENCOUNTER — Encounter: Payer: Self-pay | Admitting: Obstetrics & Gynecology

## 2020-08-10 ENCOUNTER — Telehealth: Payer: Self-pay

## 2020-08-10 NOTE — Telephone Encounter (Signed)
Spoke with pt. Pt states scheduling fasting labs with front office staff before upcoming AEX appt. Pt has AEX on 08/22/20 at 830 am with Dr Sabra Heck. Pt advised can have fasting labs the day of AEX since early appt and might need added labs. Pt agreeable and verbalized understanding.  Encounter closed.  Lab appt cancelled on 08/14/20.

## 2020-08-10 NOTE — Telephone Encounter (Signed)
Left message for pt to return call to triage RN.  AEX 08/22/20 at 0830 am with Dr Sabra Heck

## 2020-08-10 NOTE — Telephone Encounter (Signed)
Patient is wanting labs done before AEX. Patient is scheduled for labs on (08/14/20). Need orders placed.

## 2020-08-10 NOTE — Telephone Encounter (Signed)
Patient is returning a call to Stephanie. °

## 2020-08-14 ENCOUNTER — Other Ambulatory Visit: Payer: BC Managed Care – PPO

## 2020-08-18 NOTE — Progress Notes (Signed)
54 y.o. G37P2002 Married White or Caucasian female here for annual exam.  Biggest issue this summer is a mucous cyst that was removed by Dr. Doy Mince.  She's done well from a surgical standpoint.  Cycles are more irregular.  She didn't have a cycle from December to April.  Then cycles in May, June and July.  Then cycles in September.  She does have more headaches when she cycles.  Bleeding is lighter as well.  Headaches are better.    Patient's last menstrual period was 07/26/2020 (exact date).          Sexually active: Yes.    The current method of family planning is vasectomy.    Exercising: some yoga Smoker:  no  Health Maintenance: Pap:  05-19-17 neg HPV HR neg History of abnormal Pap:  Yes per patient MMG:  05-13-2020 category c density birads 1:neg Colonoscopy:  09-20-15 f/u 66yrs.  Pt is aware this is due.   BMD:   1998 heel test TDaP:  2013 Pneumonia vaccine(s):  no Shingrix:  no Hep C testing: no Screening Labs: lipids, CMP obtained today   reports that she has never smoked. She has never used smokeless tobacco. She reports current alcohol use of about 4.0 standard drinks of alcohol per week. She reports that she does not use drugs.  Past Medical History:  Diagnosis Date  . Depression   . 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 Infertility, female    took clomid  . Migraines    w/o aura  . SCC (squamous cell carcinoma) 2007   right leg  . Situational anxiety     Past Surgical History:  Procedure Laterality Date  . BREAST ENHANCEMENT SURGERY Bilateral 01/2007   implants  . BREAST ENHANCEMENT SURGERY Bilateral 5/09   reconstruction/replacement breast implants  . CESAREAN SECTION  3/98  . FINGER SURGERY    . FOOT SURGERY Bilateral 2015   2015, 2019  . NEUROMA SURGERY Right 12/2014    Current Outpatient Medications  Medication Sig Dispense Refill  . acetaminophen (TYLENOL) 325 MG tablet Take 650 mg by mouth every 6 (six) hours as needed.      . ASHWAGANDHA PO Take by mouth daily.    Marland Kitchen ibuprofen (ADVIL,MOTRIN) 200 MG tablet Take 200 mg by mouth every 6 (six) hours as needed.    . Multiple Vitamin (MULTIVITAMIN) capsule Take 1 capsule by mouth daily.    . sertraline (ZOLOFT) 100 MG tablet Take 0.5 tablets (50 mg total) by mouth daily. 90 tablet 0  . SUMAtriptan (IMITREX) 100 MG tablet Use as directed for headaches with 1 at onset and repeat in 2 hrs for a max of 2 tablets in 24 hours. 27 tablet 4  . UNABLE TO FIND as needed. allergy     No current facility-administered medications for this visit.    Family History  Problem Relation Age of Onset  . Breast cancer Maternal Grandmother 73  . Ovarian cancer Maternal Grandmother 91  . Melanoma Paternal Grandfather   . Hypertension Paternal Grandfather   . Heart attack Father        CABG  . Transient ischemic attack Father   . Melanoma Father   . Osteoarthritis Mother   . Melanoma Maternal Aunt   . Rheum arthritis Maternal Aunt   . Hypertension Maternal Grandfather   . Melanoma Maternal Grandfather   . Stroke Paternal Grandmother   . Osteoporosis Paternal Grandmother   . Colon  polyps Brother        father , brother,  PGU X 2    Review of Systems  Constitutional: Negative.   HENT: Negative.   Eyes: Negative.   Respiratory: Negative.   Cardiovascular: Negative.   Gastrointestinal: Negative.   Endocrine: Negative.   Genitourinary: Negative.   Musculoskeletal: Negative.   Skin: Negative.   Allergic/Immunologic: Negative.   Neurological: Negative.   Hematological: Negative.   Psychiatric/Behavioral: Negative.     Exam:   Ht 5' 8.5" (1.74 m)   Wt 124 lb (56.2 kg)   LMP 07/26/2020 (Exact Date)   BMI 18.58 kg/m   Height: 5' 8.5" (174 cm)  General appearance: alert, cooperative and appears stated age Head: Normocephalic, without obvious abnormality, atraumatic Neck: no adenopathy, supple, symmetrical, trachea midline and thyroid normal to inspection and  palpation Lungs: clear to auscultation bilaterally Breasts: normal appearance, no masses or tenderness Heart: regular rate and rhythm Abdomen: soft, non-tender; bowel sounds normal; no masses,  no organomegaly Extremities: extremities normal, atraumatic, no cyanosis or edema Skin: Skin color, texture, turgor normal. No rashes or lesions Lymph nodes: Cervical, supraclavicular, and axillary nodes normal. No abnormal inguinal nodes palpated Neurologic: Grossly normal   Pelvic: External genitalia:  no lesions              Urethra:  normal appearing urethra with no masses, tenderness or lesions              Bartholins and Skenes: normal                 Vagina: normal appearing vagina with normal color and discharge, no lesions              Cervix: no lesions              Pap taken: Yes.   Bimanual Exam:  Uterus:  normal size, contour, position, consistency, mobility, non-tender              Adnexa: normal adnexa and no mass, fullness, tenderness               Rectovaginal: Confirms               Anus:  normal sphincter tone, no lesions  Chaperone, Royal Hawthorn, CMA, was present for exam.  A:  Well Woman with normal exam Perimenopausal bleeding H/o migraines without aura Family and personal hx of polyps  P:   Mammogram gudelines reviewed.  Doing 3D MMG. pap smear with HR HPV obtained today Lipids and CMP obtained today RF for Imitrex 100mg  at headache onset, repeat in 2 hours if needed.  200mg  max/24 hours.  #27/4RF. RF for Zoloft 100mg  1/2 tabe daily.  #90/4RF Health form for work filled out today Colonoscopy due this year.  Pt aware. BMD screening discussed.  Do not recommend right now. Shingrix vaccination discussed.  She is going to have this done sometime in the next year. Return annually or prn

## 2020-08-22 ENCOUNTER — Encounter: Payer: Self-pay | Admitting: Obstetrics & Gynecology

## 2020-08-22 ENCOUNTER — Other Ambulatory Visit (HOSPITAL_COMMUNITY)
Admission: RE | Admit: 2020-08-22 | Discharge: 2020-08-22 | Disposition: A | Discharge status: Home / Self Care | Payer: BC Managed Care – PPO | Source: Ambulatory Visit | Attending: Obstetrics & Gynecology | Admitting: Obstetrics & Gynecology

## 2020-08-22 ENCOUNTER — Ambulatory Visit (INDEPENDENT_AMBULATORY_CARE_PROVIDER_SITE_OTHER): Payer: BC Managed Care – PPO | Admitting: Obstetrics & Gynecology

## 2020-08-22 ENCOUNTER — Other Ambulatory Visit: Payer: Self-pay

## 2020-08-22 VITALS — BP 110/70 | HR 68 | Resp 16 | Ht 68.5 in | Wt 124.0 lb

## 2020-08-22 DIAGNOSIS — Z01419 Encounter for gynecological examination (general) (routine) without abnormal findings: Secondary | ICD-10-CM

## 2020-08-22 DIAGNOSIS — Z124 Encounter for screening for malignant neoplasm of cervix: Secondary | ICD-10-CM | POA: Insufficient documentation

## 2020-08-22 DIAGNOSIS — Z0000 Encounter for general adult medical examination without abnormal findings: Secondary | ICD-10-CM | POA: Diagnosis not present

## 2020-08-22 MED ORDER — SUMATRIPTAN SUCCINATE 100 MG PO TABS: ORAL_TABLET | ORAL | 4 refills | Status: DC

## 2020-08-22 MED ORDER — SERTRALINE HCL 100 MG PO TABS: 50.0000 mg | ORAL_TABLET | Freq: Every day | ORAL | 0 refills | Status: DC

## 2020-08-23 LAB — COMPREHENSIVE METABOLIC PANEL
ALT: 10 IU/L (ref 0–32)
AST: 14 IU/L (ref 0–40)
Albumin/Globulin Ratio: 2.2 (ref 1.2–2.2)
Albumin: 4.9 g/dL (ref 3.8–4.9)
Alkaline Phosphatase: 48 IU/L (ref 44–121)
BUN/Creatinine Ratio: 18 (ref 9–23)
BUN: 12 mg/dL (ref 6–24)
Bilirubin Total: 0.6 mg/dL (ref 0.0–1.2)
CO2: 27 mmol/L (ref 20–29)
Calcium: 9.7 mg/dL (ref 8.7–10.2)
Chloride: 101 mmol/L (ref 96–106)
Creatinine, Ser: 0.67 mg/dL (ref 0.57–1.00)
GFR calc Af Amer: 114 mL/min/{1.73_m2} (ref >59)
GFR calc non Af Amer: 99 mL/min/{1.73_m2} (ref >59)
Globulin, Total: 2.2 g/dL (ref 1.5–4.5)
Glucose: 82 mg/dL (ref 65–99)
Potassium: 5.2 mmol/L (ref 3.5–5.2)
Sodium: 140 mmol/L (ref 134–144)
Total Protein: 7.1 g/dL (ref 6.0–8.5)

## 2020-08-23 LAB — CYTOLOGY - PAP
Comment: NEGATIVE
Diagnosis: NEGATIVE
High risk HPV: NEGATIVE

## 2020-08-23 LAB — LIPID PANEL
Chol/HDL Ratio: 2.5 ratio (ref 0.0–4.4)
Cholesterol, Total: 197 mg/dL (ref 100–199)
HDL: 80 mg/dL (ref >39)
LDL Chol Calc (NIH): 103 mg/dL — ABNORMAL HIGH (ref 0–99)
Triglycerides: 76 mg/dL (ref 0–149)
VLDL Cholesterol Cal: 14 mg/dL (ref 5–40)

## 2021-01-03 DIAGNOSIS — L570 Actinic keratosis: Secondary | ICD-10-CM | POA: Diagnosis not present

## 2021-01-03 DIAGNOSIS — L817 Pigmented purpuric dermatosis: Secondary | ICD-10-CM | POA: Diagnosis not present

## 2021-01-03 DIAGNOSIS — Z85828 Personal history of other malignant neoplasm of skin: Secondary | ICD-10-CM | POA: Diagnosis not present

## 2021-01-03 DIAGNOSIS — D2261 Melanocytic nevi of right upper limb, including shoulder: Secondary | ICD-10-CM | POA: Diagnosis not present

## 2021-01-03 DIAGNOSIS — D2262 Melanocytic nevi of left upper limb, including shoulder: Secondary | ICD-10-CM | POA: Diagnosis not present

## 2021-03-15 ENCOUNTER — Other Ambulatory Visit: Payer: Self-pay | Admitting: Obstetrics & Gynecology

## 2021-03-15 DIAGNOSIS — Z1231 Encounter for screening mammogram for malignant neoplasm of breast: Secondary | ICD-10-CM

## 2021-03-22 DIAGNOSIS — M67449 Ganglion, unspecified hand: Secondary | ICD-10-CM | POA: Diagnosis not present

## 2021-05-14 ENCOUNTER — Other Ambulatory Visit: Payer: Self-pay

## 2021-05-14 ENCOUNTER — Ambulatory Visit
Admission: RE | Admit: 2021-05-14 | Discharge: 2021-05-14 | Disposition: A | Discharge status: Home / Self Care | Payer: BC Managed Care – PPO | Source: Ambulatory Visit | Attending: Obstetrics & Gynecology | Admitting: Obstetrics & Gynecology

## 2021-05-14 DIAGNOSIS — Z1231 Encounter for screening mammogram for malignant neoplasm of breast: Secondary | ICD-10-CM

## 2021-06-26 ENCOUNTER — Telehealth (HOSPITAL_BASED_OUTPATIENT_CLINIC_OR_DEPARTMENT_OTHER): Payer: Self-pay | Admitting: Obstetrics & Gynecology

## 2021-06-26 NOTE — Telephone Encounter (Signed)
Patient called would like Dr.Miller to put a order in for fasting  labs done for insurance purpose and needs it be done separately from her annual .Patient knows Dr.Miller will not be back to next week.

## 2021-07-02 ENCOUNTER — Other Ambulatory Visit (HOSPITAL_BASED_OUTPATIENT_CLINIC_OR_DEPARTMENT_OTHER): Payer: Self-pay | Admitting: Obstetrics & Gynecology

## 2021-07-05 ENCOUNTER — Other Ambulatory Visit (HOSPITAL_BASED_OUTPATIENT_CLINIC_OR_DEPARTMENT_OTHER): Payer: Self-pay | Admitting: Obstetrics & Gynecology

## 2021-07-05 DIAGNOSIS — E7800 Pure hypercholesterolemia, unspecified: Secondary | ICD-10-CM

## 2021-07-06 NOTE — Telephone Encounter (Signed)
Pt given appt for fasting labwork to be done

## 2021-07-10 ENCOUNTER — Other Ambulatory Visit: Payer: Self-pay | Admitting: Obstetrics & Gynecology

## 2021-07-10 NOTE — Telephone Encounter (Signed)
LMOVM that refill can be addressed at her appt tomorrow morning

## 2021-07-10 NOTE — Telephone Encounter (Signed)
Refill will be provided at appt with Dr. Sabra Heck tomorrow

## 2021-07-11 ENCOUNTER — Ambulatory Visit (HOSPITAL_BASED_OUTPATIENT_CLINIC_OR_DEPARTMENT_OTHER): Payer: BC Managed Care – PPO

## 2021-07-20 ENCOUNTER — Other Ambulatory Visit: Payer: Self-pay

## 2021-07-20 ENCOUNTER — Other Ambulatory Visit (HOSPITAL_BASED_OUTPATIENT_CLINIC_OR_DEPARTMENT_OTHER): Payer: Self-pay | Admitting: Obstetrics & Gynecology

## 2021-07-20 ENCOUNTER — Ambulatory Visit (HOSPITAL_BASED_OUTPATIENT_CLINIC_OR_DEPARTMENT_OTHER): Payer: BC Managed Care – PPO

## 2021-07-20 DIAGNOSIS — Z0000 Encounter for general adult medical examination without abnormal findings: Secondary | ICD-10-CM | POA: Diagnosis not present

## 2021-07-21 LAB — COMPREHENSIVE METABOLIC PANEL
ALT: 15 IU/L (ref 0–32)
AST: 20 IU/L (ref 0–40)
Albumin/Globulin Ratio: 2.8 — ABNORMAL HIGH (ref 1.2–2.2)
Albumin: 5 g/dL — ABNORMAL HIGH (ref 3.8–4.9)
Alkaline Phosphatase: 45 IU/L (ref 44–121)
BUN/Creatinine Ratio: 12 (ref 9–23)
BUN: 8 mg/dL (ref 6–24)
Bilirubin Total: 0.7 mg/dL (ref 0.0–1.2)
CO2: 25 mmol/L (ref 20–29)
Calcium: 10 mg/dL (ref 8.7–10.2)
Chloride: 101 mmol/L (ref 96–106)
Creatinine, Ser: 0.69 mg/dL (ref 0.57–1.00)
Globulin, Total: 1.8 g/dL (ref 1.5–4.5)
Glucose: 85 mg/dL (ref 65–99)
Potassium: 5.2 mmol/L (ref 3.5–5.2)
Sodium: 138 mmol/L (ref 134–144)
Total Protein: 6.8 g/dL (ref 6.0–8.5)
eGFR: 102 mL/min/{1.73_m2} (ref >59)

## 2021-07-21 LAB — CBC
Hematocrit: 39.5 % (ref 34.0–46.6)
Hemoglobin: 12.9 g/dL (ref 11.1–15.9)
MCH: 30.1 pg (ref 26.6–33.0)
MCHC: 32.7 g/dL (ref 31.5–35.7)
MCV: 92 fL (ref 79–97)
Platelets: 177 10*3/uL (ref 150–450)
RBC: 4.28 x10E6/uL (ref 3.77–5.28)
RDW: 12.4 % (ref 11.7–15.4)
WBC: 3.9 10*3/uL (ref 3.4–10.8)

## 2021-07-31 ENCOUNTER — Encounter (HOSPITAL_BASED_OUTPATIENT_CLINIC_OR_DEPARTMENT_OTHER): Payer: Self-pay

## 2021-08-06 ENCOUNTER — Other Ambulatory Visit (HOSPITAL_BASED_OUTPATIENT_CLINIC_OR_DEPARTMENT_OTHER): Payer: Self-pay | Admitting: *Deleted

## 2021-08-06 MED ORDER — SERTRALINE HCL 100 MG PO TABS: 50.0000 mg | ORAL_TABLET | Freq: Every day | ORAL | 0 refills | Status: DC

## 2021-08-07 DIAGNOSIS — G43019 Migraine without aura, intractable, without status migrainosus: Secondary | ICD-10-CM | POA: Diagnosis not present

## 2021-08-07 DIAGNOSIS — Z1211 Encounter for screening for malignant neoplasm of colon: Secondary | ICD-10-CM | POA: Diagnosis not present

## 2021-08-07 DIAGNOSIS — F419 Anxiety disorder, unspecified: Secondary | ICD-10-CM | POA: Diagnosis not present

## 2021-08-29 DIAGNOSIS — Z1211 Encounter for screening for malignant neoplasm of colon: Secondary | ICD-10-CM | POA: Diagnosis not present

## 2021-09-13 ENCOUNTER — Encounter (HOSPITAL_BASED_OUTPATIENT_CLINIC_OR_DEPARTMENT_OTHER): Payer: Self-pay | Admitting: Obstetrics & Gynecology

## 2021-09-13 ENCOUNTER — Ambulatory Visit (INDEPENDENT_AMBULATORY_CARE_PROVIDER_SITE_OTHER): Payer: BC Managed Care – PPO | Admitting: Obstetrics & Gynecology

## 2021-09-13 ENCOUNTER — Other Ambulatory Visit: Payer: Self-pay

## 2021-09-13 VITALS — BP 106/79 | HR 68 | Ht 68.5 in | Wt 126.4 lb

## 2021-09-13 DIAGNOSIS — Z01419 Encounter for gynecological examination (general) (routine) without abnormal findings: Secondary | ICD-10-CM

## 2021-09-13 DIAGNOSIS — Z0000 Encounter for general adult medical examination without abnormal findings: Secondary | ICD-10-CM

## 2021-09-13 DIAGNOSIS — Z86010 Personal history of colonic polyps: Secondary | ICD-10-CM | POA: Diagnosis not present

## 2021-09-13 DIAGNOSIS — N951 Menopausal and female climacteric states: Secondary | ICD-10-CM | POA: Diagnosis not present

## 2021-09-13 DIAGNOSIS — E7800 Pure hypercholesterolemia, unspecified: Secondary | ICD-10-CM

## 2021-09-13 MED ORDER — SERTRALINE HCL 100 MG PO TABS: 50.0000 mg | ORAL_TABLET | Freq: Every day | ORAL | 3 refills | Status: DC

## 2021-09-13 NOTE — Progress Notes (Signed)
55 y.o. G40P2002 Married White or Caucasian female here for annual exam.  Had a four day episode of very light bleeding in May.  Has had nothing since.  Headaches are improved.  Hot flashes are improved.  Sleep is improved.  Has form for completion of physical exam, lab work that needs to be signed today.  No LMP recorded. (Menstrual status: Perimenopausal).          Sexually active: Yes.    The current method of family planning is vasectomy.    Exercising: No.   Smoker:  no  Health Maintenance: Pap:  08/22/2020 Negative History of abnormal Pap:  remote hx MMG:  05/14/2021 Negative Colonoscopy:  Patient states she had procedure done first week in October. (08/29/2021).  Follow up 10 years.  Dr. Collene Mares Screening Labs: done earlier but needs lipids to be drawn today   reports that she has never smoked. She has never used smokeless tobacco. She reports current alcohol use of about 4.0 standard drinks per week. She reports that she does not use drugs.  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      ibuprofen (ADVIL,MOTRIN) 200 MG tablet Take 200 mg by mouth every 6 (six) hours as needed.     Multiple Vitamin (MULTIVITAMIN) capsule Take 1 capsule by mouth daily.     sertraline (ZOLOFT) 100 MG tablet Take 0.5  tablets (50 mg total) by mouth daily. 90 tablet 0   SUMAtriptan (IMITREX) 100 MG tablet Use as directed for headaches with 1 at onset and repeat in 2 hrs for a max of 2 tablets in 24 hours. 27 tablet 4   UNABLE TO FIND as needed. allergy     ASHWAGANDHA PO Take by mouth daily. (Patient not taking: Reported on 09/13/2021)     No current facility-administered medications for this visit.    Family History  Problem Relation Age of Onset   Breast cancer Maternal Grandmother 28   Ovarian cancer Maternal Grandmother 6   Melanoma Paternal Grandfather    Hypertension Paternal Grandfather    Heart attack Father        CABG   Transient ischemic attack Father    Melanoma Father    Osteoarthritis Mother    Melanoma Maternal Aunt    Rheum arthritis Maternal Aunt    Hypertension Maternal Grandfather    Melanoma Maternal Grandfather    Stroke Paternal Grandmother    Osteoporosis Paternal Grandmother    Colon polyps Brother        father , brother,  PGU X 2    Review of Systems  All other systems reviewed and are negative.  Exam:   BP 106/79 (BP Location: Right  Arm, Patient Position: Sitting, Cuff Size: Normal)   Pulse 68   Ht 5' 8.5" (1.74 m) Comment: reported  Wt 126 lb 6.4 oz (57.3 kg)   BMI 18.94 kg/m   Height: 5' 8.5" (174 cm) (reported)  General appearance: alert, cooperative and appears stated age Head: Normocephalic, without obvious abnormality, atraumatic Neck: no adenopathy, supple, symmetrical, trachea midline and thyroid normal to inspection and palpation Lungs: clear to auscultation bilaterally Breasts: normal appearance, no masses or tenderness Heart: regular rate and rhythm Abdomen: soft, non-tender; bowel sounds normal; no masses,  no organomegaly Extremities: extremities normal, atraumatic, no cyanosis or edema Skin: Skin color, texture, turgor normal. No rashes or lesions Lymph nodes: Cervical, supraclavicular, and axillary nodes normal. No abnormal inguinal nodes  palpated Neurologic: Grossly normal  Pelvic: External genitalia:  no lesions              Urethra:  normal appearing urethra with no masses, tenderness or lesions              Bartholins and Skenes: normal                 Vagina: normal appearing vagina with normal color and no discharge, no lesions              Cervix: no lesions              Pap taken: No. Bimanual Exam:  Uterus:  normal size, contour, position, consistency, mobility, non-tender              Adnexa: normal adnexa and no mass, fullness, tenderness               Rectovaginal: Confirms               Anus:  normal sphincter tone, no lesions  Chaperone, Octaviano Batty, CMA, was present for exam.  Assessment/Plan: 1. Well woman exam with routine gynecological exam - pap neg with neg HR HPV 2021 - MMG 04/2021 - Colonoscopy done 08/29/2021.  Release signed today. - BMD not due yet - lipids will be obtained today  2. Perimenopausal  3. Elevated LDL cholesterol level - lipids ordered today  4. History of colon polyps

## 2021-09-14 LAB — LIPID PANEL
Chol/HDL Ratio: 2.5 ratio (ref 0.0–4.4)
Cholesterol, Total: 193 mg/dL (ref 100–199)
HDL: 78 mg/dL (ref >39)
LDL Chol Calc (NIH): 100 mg/dL — ABNORMAL HIGH (ref 0–99)
Triglycerides: 86 mg/dL (ref 0–149)
VLDL Cholesterol Cal: 15 mg/dL (ref 5–40)

## 2021-09-15 DIAGNOSIS — E7800 Pure hypercholesterolemia, unspecified: Secondary | ICD-10-CM | POA: Insufficient documentation

## 2021-09-15 DIAGNOSIS — Z86010 Personal history of colonic polyps: Secondary | ICD-10-CM | POA: Insufficient documentation

## 2021-12-16 ENCOUNTER — Other Ambulatory Visit: Payer: Self-pay | Admitting: Obstetrics & Gynecology

## 2022-02-20 DIAGNOSIS — D2261 Melanocytic nevi of right upper limb, including shoulder: Secondary | ICD-10-CM | POA: Diagnosis not present

## 2022-02-20 DIAGNOSIS — D225 Melanocytic nevi of trunk: Secondary | ICD-10-CM | POA: Diagnosis not present

## 2022-02-20 DIAGNOSIS — L570 Actinic keratosis: Secondary | ICD-10-CM | POA: Diagnosis not present

## 2022-02-20 DIAGNOSIS — D2262 Melanocytic nevi of left upper limb, including shoulder: Secondary | ICD-10-CM | POA: Diagnosis not present

## 2022-02-20 DIAGNOSIS — D485 Neoplasm of uncertain behavior of skin: Secondary | ICD-10-CM | POA: Diagnosis not present

## 2022-02-20 DIAGNOSIS — Z85828 Personal history of other malignant neoplasm of skin: Secondary | ICD-10-CM | POA: Diagnosis not present

## 2022-03-04 ENCOUNTER — Encounter: Payer: Self-pay | Admitting: Family Medicine

## 2022-04-07 ENCOUNTER — Encounter: Payer: Self-pay | Admitting: Family Medicine

## 2022-04-07 NOTE — Progress Notes (Signed)
Chief Complaint  ?Patient presents with  ? Annual Exam  ?  Fasting annual exam, no pap. Sees eye doctor regularly. No concerns. She has had a Tdap last year-not in [place]. Wants to wait on covid booster.   ? ? ?Alyssa Reynolds is a 56 y.o. female who presents for a complete physical.  She has not been seen by me since 2015.  She is under the care of Dr. Sabra Heck, and had routine GYN exam in 08/2021. ? ?Depression/anxiety:  on sertraline from [facility] [address]).  She reports recurrent symptoms when she tried to wean off (became very weepy, feels better taking it).   Started when kids were little. She denies side effects, and would like to continue. ? ?Migraines:  she gets about every 2-3 weeks, triggered by weather/barometric pressure changes. She doesn't get an aura. She reports that the migraines aren't severe, but that OTC meds do not resolve it. It requires imitrex, though often just 1/2 tablet is effective. ? ? ?Immunization History  ?Administered Date(s) Administered  ? Influenza Nasal 08/12/2012  ? Influenza Split 09/10/2013, 09/26/2014, 07/18/2021  ? Influenza,inj,Quad PF,6+ Mos 10/13/2017, 10/18/2018, 09/13/2019  ? Influenza,inj,quad, With Preservative 10/18/2018  ? Influenza-Unspecified 10/03/2015  ? PFIZER(Purple Top)SARS-COV-2 Vaccination 02/05/2020, 02/29/2020, 11/03/2020, 04/20/2021  ? Tdap 11/13/2012  ? Zoster Recombinat (Shingrix) 04/20/2021, 07/20/2021  ? ?Last Pap smear: 07/2020 normal, no HR HPV ?Last mammogram: 04/2021 ?Last colonoscopy: 08/2021 diverticulosis, internal hemorrhoids (Dr. Collene Mares) ?Last DEXA: never ?Dentist: twice a year ?Ophtho: yearly (contacts/glasses) ?Exercise: walks occasionally, morning yoga, push-ups. No weight-bearing exercise, other than carrying cat carriers (does this regularly) ?Sees derm yearly ? ?Lipid screen: ?Lab Results  ?Component Value Date  ? CHOL 193 09/13/2021  ? HDL 78 09/13/2021  ? Sulphur 100 (H) 09/13/2021  ? TRIG 86 09/13/2021  ? CHOLHDL 2.5 09/13/2021  ? ?Normal  c-met and CBC in 06/2021 ? ?Drinks a lot of soymilk, cheese. Ritual vitamin contains 2000 IU of D3 ? ?Vitamin D-OH 29.9 in 05/2019 ?Normal TSH 05/2019 ? ? ? ?PMH, PSH, SH and FH were reviewed and updated ? ?Outpatient Encounter Medications as of 04/08/2022  ?Medication Sig Note  ? Multiple Vitamin (MULTIVITAMIN) capsule Take 1 capsule by mouth daily. 04/08/2022: Ritual MVI--contains 2000 IU of D3  ? sertraline (ZOLOFT) 100 MG tablet Take 0.5 tablets (50 mg total) by mouth daily.   ? SUMAtriptan (IMITREX) 100 MG tablet AS DIRECTED FOR HEADACHES WITH 1 TABLET AT ONSET AND REPEAT IN 2 HOURS FOR A MAX DOSE OF 2 TABLETS IN 24 HOURS 04/08/2022: Took one Sat am  ? acetaminophen (TYLENOL) 325 MG tablet Take 650 mg by mouth every 6 (six) hours as needed.  (Patient not taking: Reported on 04/08/2022) 04/08/2022: prn  ? [DISCONTINUED] ASHWAGANDHA PO Take by mouth daily. (Patient not taking: Reported on 09/13/2021)   ? [DISCONTINUED] betamethasone dipropionate 0.05 % cream Apply 1 application. topically 2 (two) times daily.   ? [DISCONTINUED] D 1000 25 MCG (1000 UT) CHEW SMARTSIG:1 By Mouth   ? [DISCONTINUED] ibuprofen (ADVIL,MOTRIN) 200 MG tablet Take 200 mg by mouth every 6 (six) hours as needed.   ? [DISCONTINUED] UNABLE TO FIND as needed. allergy   ? ?No facility-administered encounter medications on file as of 04/08/2022.  ? ?No Known Allergies ? ?ROS:  The patient denies anorexia, fever, weight changes, vision changes, decreased hearing, ear pain, sore throat, breast concerns, chest pain, palpitations, dizziness, syncope, dyspnea on exertion, cough, swelling, nausea, vomiting, diarrhea, constipation, abdominal pain, melena, hematochezia, indigestion/heartburn,  hematuria, incontinence, dysuria, vaginal bleeding, discharge, odor or itch, genital lesions, joint pains, numbness, tingling, weakness, tremor, suspicious skin lesions, depression, anxiety, abnormal bleeding/bruising, or enlarged lymph nodes. ? ?No menstrual cycles in a  year.  Hot flashes resolved, just rare/mild. ?Migraines per HPI, r/b imitrex. ? ? ?PHYSICAL EXAM: ? ?BP 120/72   Pulse 68   Ht '5\' 8"'  (1.727 m)   Wt 125 lb 6.4 oz (56.9 kg)   BMI 19.07 kg/m?  ? ?Wt Readings from Last 3 Encounters:  ?04/08/22 125 lb 6.4 oz (56.9 kg)  ?09/13/21 126 lb 6.4 oz (57.3 kg)  ?08/22/20 124 lb (56.2 kg)  ? ?General Appearance:    Alert, cooperative, no distress, appears stated age. She did not change into gown.  ?Head:    Normocephalic, without obvious abnormality, atraumatic  ?Eyes:    PERRL, conjunctiva/corneas clear, EOM's intact, fundi  ?  benign  ?Ears:    Normal TM's and external ear canals  ?Nose:   Nares normal, mucosa normal, no drainage or sinus   tenderness  ?Throat:   Lips, mucosa, and tongue normal; teeth and gums normal  ?Neck:   Supple, no lymphadenopathy;  thyroid:  no enlargement/ tenderness/nodules; no carotid bruit or JVD  ?Back:    Spine nontender, no curvature, ROM normal, no CVA     tenderness  ?Lungs:     Clear to auscultation bilaterally without wheezes, rales or     ronchi; respirations unlabored  ?Chest Wall:    No tenderness or deformity  ? Heart:    Regular rate and rhythm, S1 and S2 normal, no murmur, rub ?  or gallop  ?Breast Exam:    Deferred to GYN  ?Abdomen:     Soft, non-tender, nondistended, normoactive bowel sounds,  ?  no masses, no hepatosplenomegaly  ?Genitalia:    Deferred to GYN  ?   ?Extremities:   No clubbing, cyanosis or edema  ?Pulses:   2+ and symmetric all extremities  ?Skin:   Skin color, texture, turgor normal, no rashes or lesions visible, but skin is exam is significantly limited (not undressed; sees derm)  ?Lymph nodes:   No cervical, inguinal or supraclavicular lymphadenopathy.  ?Neurologic:   CNII-XII intact, normal strength, sensation and gait; reflexes 2+ and symmetric throughout  ?        Psych:   Normal mood, affect, hygiene and grooming.   ? ? ?ASSESSMENT/PLAN: ? ?Annual physical exam - Plan: POCT Urinalysis DIP ([employer]  Device) ? 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 ?Migraine without aura and without status migrainosus, not intractable - pt is pleased with using imitrex prn, despite frequency. Cont prn use ? ?Anxiety and depression - doing well on sertraline; prefers to continue, had recurrence when tried tapering dose in past. ? ?Discussed monthly self breast exams and yearly mammograms; at least 30 minutes of aerobic activity at least 5 days/week, weight-bearing exercise at least 2x/week; proper sunscreen use reviewed; healthy diet, including goals of calcium and vitamin D intake and alcohol recommendations (less than or equal to 1 drink/day) reviewed; regular seatbelt use; changing batteries in smoke detectors, rec carbon monoxide detectors.  Immunization recommendations discussed--continue yearly flu shots. Bivalent COVID booster given, recommended new version when available in the Fall. ?TdaP due--we checked the 2 pharmacies where she would have gotten it, and no record of giving TdaP/Td.  Will be due later this year (can get from pharmacy vs [facility]) ?Colonoscopy recommendations reviewed, UTD ? ?F/u 1 year,  sooner prn ? ?

## 2022-04-07 NOTE — Patient Instructions (Addendum)
?  HEALTH MAINTENANCE RECOMMENDATIONS: ? ?It is recommended that you get at least 30 minutes of aerobic exercise at least 5 days/week (for weight loss, you may need as much as 60-90 minutes). This can be any activity that gets your heart rate up. This can be divided in 10-15 minute intervals if needed, but try and build up your endurance at least once a week.  Weight bearing exercise is also recommended twice weekly. ? ?Eat a healthy diet with lots of vegetables, fruits and fiber.  "Colorful" foods have a lot of vitamins (ie green vegetables, tomatoes, red peppers, etc).  Limit sweet tea, regular sodas and alcoholic beverages, all of which has a lot of calories and sugar.  Up to 1 alcoholic drink daily may be beneficial for women (unless trying to lose weight, watch sugars).  Drink a lot of water. ? ?Calcium recommendations are 1200-1500 mg daily (1500 mg for postmenopausal women or women without ovaries), and vitamin D 1000 IU daily.  This should be obtained from diet and/or supplements (vitamins), and calcium should not be taken all at once, but in divided doses. ? ?Monthly self breast exams and yearly mammograms for women over the age of 52 is recommended. ? ?Sunscreen of at least SPF 30 should be used on all sun-exposed parts of the skin when outside between the hours of 10 am and 4 pm (not just when at beach or pool, but even with exercise, golf, tennis, and yard work!)  Use a sunscreen that says "broad spectrum" so it covers both UVA and UVB rays, and make sure to reapply every 1-2 hours. ? ?Remember to change the batteries in your smoke detectors when changing your clock times in the spring and fall. If your detectors are more than 57 years old, please replace them all.  Carbon monoxide detectors are recommended for your home. ? ?Use your seat belt every time you are in a car, and please drive safely and not be distracted with cell phones and texting while driving. ? ?Check about the tetanus booster (TdaP)--We  have that you are due for your next booster in 10/2022 (10 years from the last one we have documented). If you got one from a pharmacy, your insurance likely will know about it. ? ?

## 2022-04-08 ENCOUNTER — Ambulatory Visit (INDEPENDENT_AMBULATORY_CARE_PROVIDER_SITE_OTHER): Payer: BC Managed Care – PPO | Admitting: Family Medicine

## 2022-04-08 ENCOUNTER — Encounter: Payer: Self-pay | Admitting: Family Medicine

## 2022-04-08 VITALS — BP 120/72 | HR 68 | Ht 68.0 in | Wt 125.4 lb

## 2022-04-08 DIAGNOSIS — F419 Anxiety disorder, unspecified: Secondary | ICD-10-CM | POA: Diagnosis not present

## 2022-04-08 DIAGNOSIS — Z23 Encounter for immunization: Secondary | ICD-10-CM | POA: Diagnosis not present

## 2022-04-08 DIAGNOSIS — G43009 Migraine without aura, not intractable, without status migrainosus: Secondary | ICD-10-CM

## 2022-04-08 DIAGNOSIS — Z0000 Encounter for general adult medical examination without abnormal findings: Secondary | ICD-10-CM | POA: Diagnosis not present

## 2022-04-08 DIAGNOSIS — F32A Depression, unspecified: Secondary | ICD-10-CM

## 2022-04-08 LAB — POCT URINALYSIS DIP (PROADVANTAGE DEVICE)
Bilirubin, UA: NEGATIVE
Blood, UA: NEGATIVE
Glucose, UA: NEGATIVE mg/dL
Ketones, POC UA: NEGATIVE mg/dL
Leukocytes, UA: NEGATIVE
Nitrite, UA: NEGATIVE
Protein Ur, POC: NEGATIVE mg/dL
Specific Gravity, Urine: 1.015
Urobilinogen, Ur: NEGATIVE
pH, UA: 7 (ref 5.0–8.0)

## 2022-04-12 ENCOUNTER — Other Ambulatory Visit: Payer: Self-pay | Admitting: Obstetrics & Gynecology

## 2022-04-12 DIAGNOSIS — Z1231 Encounter for screening mammogram for malignant neoplasm of breast: Secondary | ICD-10-CM

## 2022-05-20 ENCOUNTER — Ambulatory Visit
Admission: RE | Admit: 2022-05-20 | Discharge: 2022-05-20 | Disposition: A | Discharge status: Home / Self Care | Payer: BC Managed Care – PPO | Source: Ambulatory Visit | Attending: Obstetrics & Gynecology | Admitting: Obstetrics & Gynecology

## 2022-05-20 DIAGNOSIS — Z1231 Encounter for screening mammogram for malignant neoplasm of breast: Secondary | ICD-10-CM

## 2022-07-02 IMAGING — MG DIGITAL SCREENING BREAST BILAT IMPLANT W/ TOMO W/ CAD
9 of 12 series · 9 of 28 positions shown · non-contrast
Comparison: Previous exam(s).

CLINICAL DATA: Screening.

EXAM:
DIGITAL SCREENING BILATERAL MAMMOGRAM WITH IMPLANTS, CAD AND
TOMOSYNTHESIS
TECHNIQUE: Bilateral screening digital craniocaudal and mediolateral oblique
mammograms were obtained. Bilateral screening digital breast
tomosynthesis was performed. The images were evaluated with
computer-aided detection. Standard and/or implant displaced views
were performed.

[L CC]
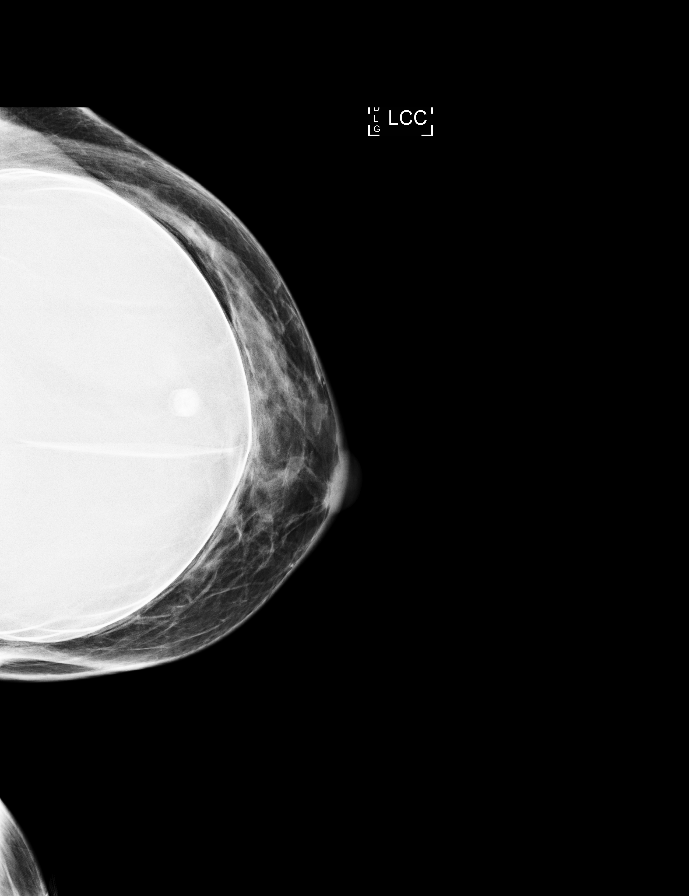

[R MLO]
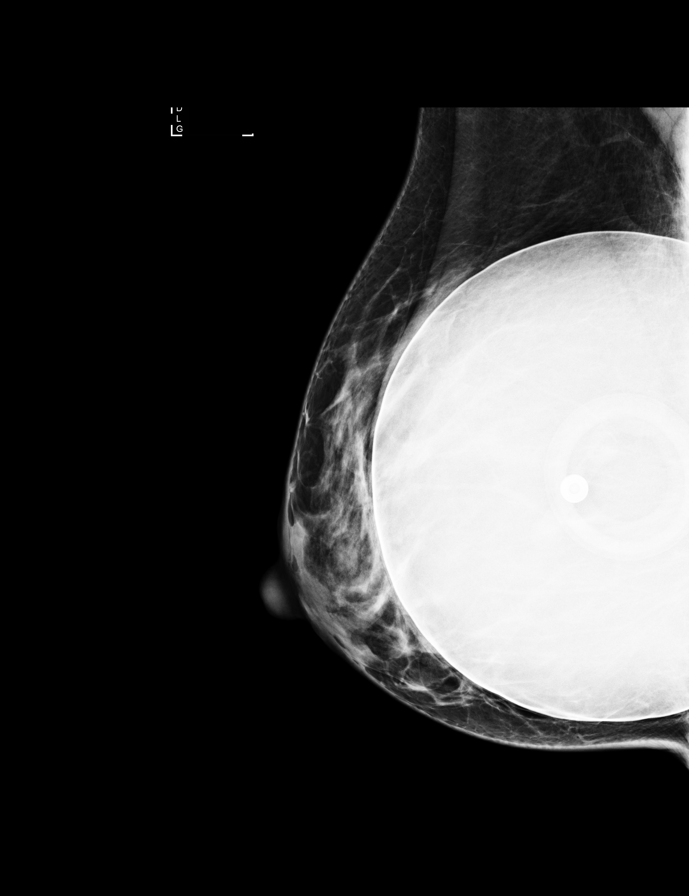

[L MLO]
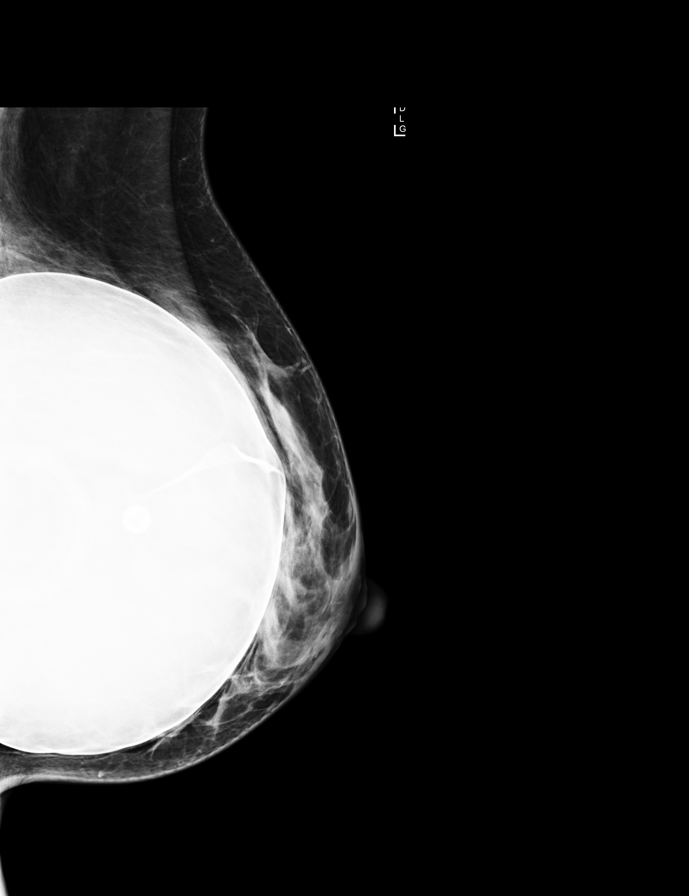

[R CC]
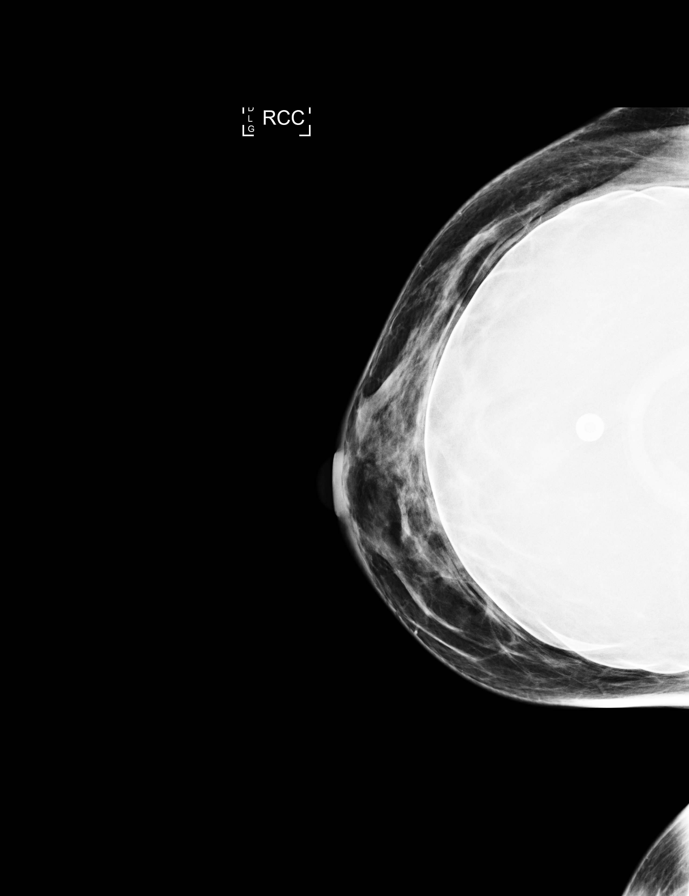

[R CC synth-2D]
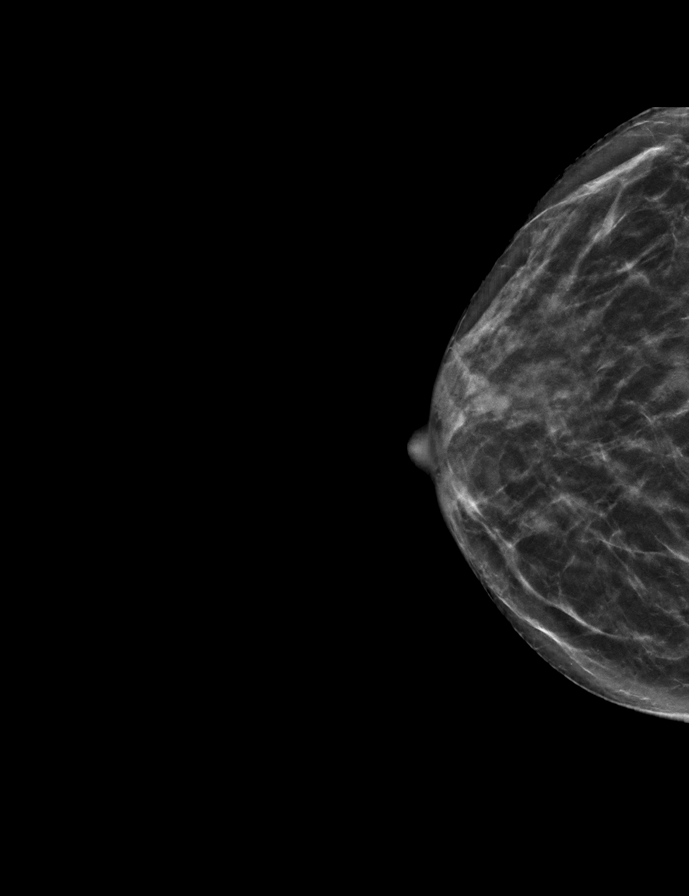

[R MLO synth-2D]
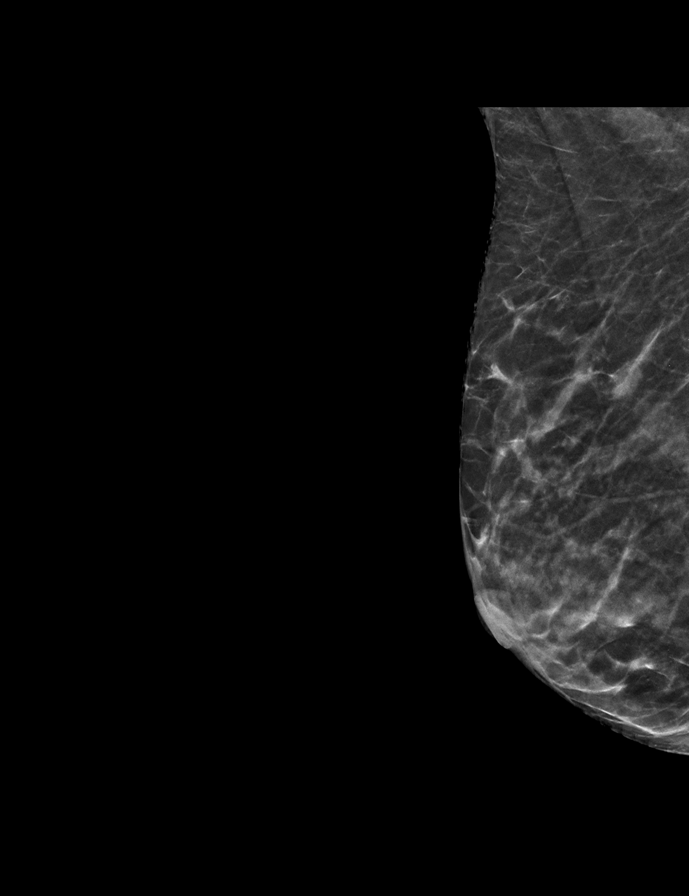

[L MLO synth-2D]
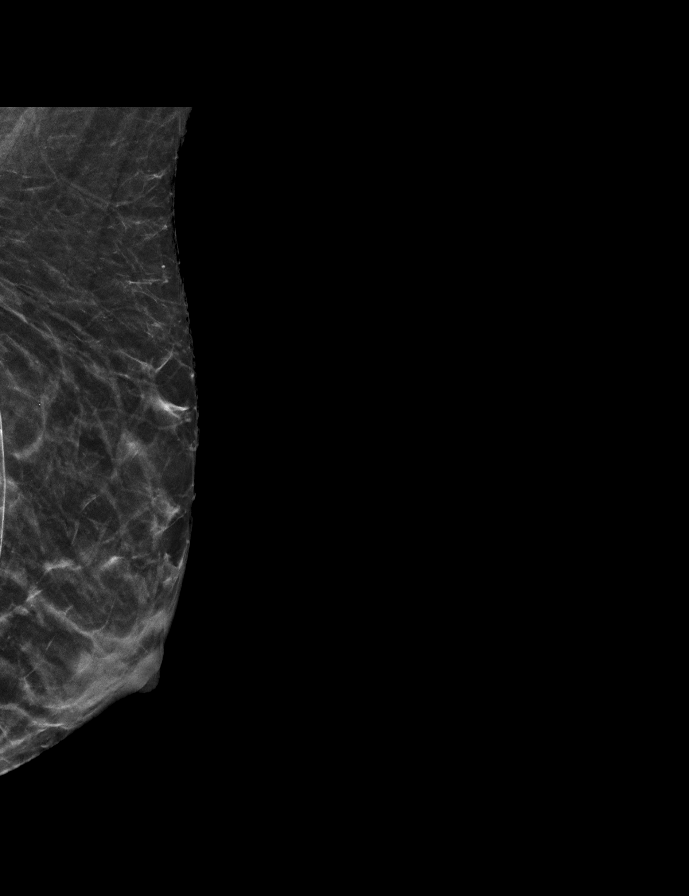

[L CC synth-2D]
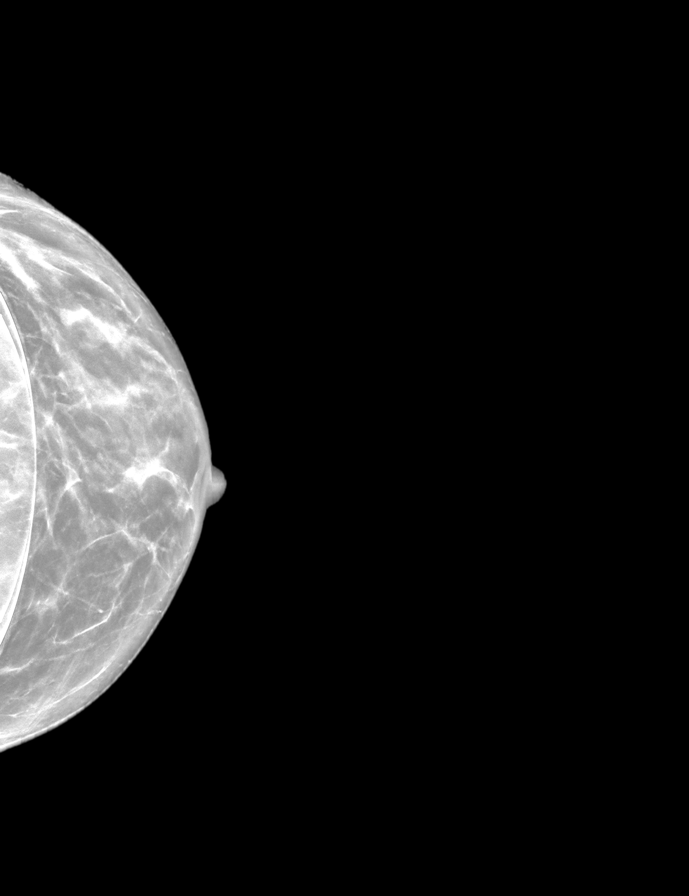

[L MLOID BREAST TOMOSYNTHESIS IMAGE tomo · tomo slice 23/46.0]
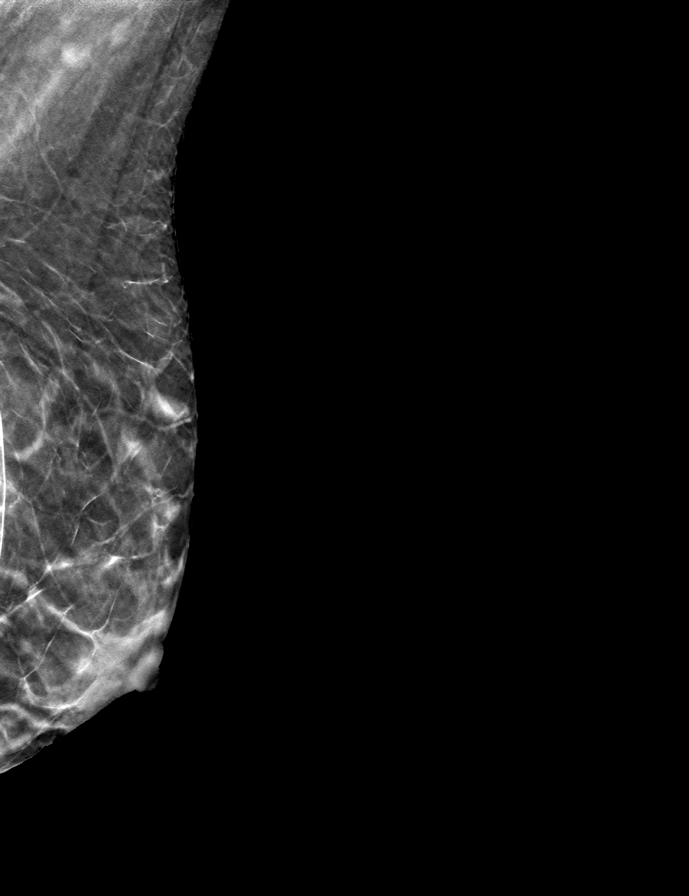

[9 of 28 positions shown; findings below may reference images not displayed]

ACR Breast Density Category c: The breast tissue is heterogeneously
dense, which may obscure small masses.
FINDINGS: The patient has retropectoral implants. There are no findings
suspicious for malignancy.
IMPRESSION: No mammographic evidence of malignancy. A result letter of this
screening mammogram will be mailed directly to the patient.

RECOMMENDATION:
Screening mammogram in one year. (Code:LT-E-7TH)

BI-RADS CATEGORY  1:  Negative.

## 2022-07-31 ENCOUNTER — Encounter: Payer: Self-pay | Admitting: Internal Medicine

## 2022-09-20 ENCOUNTER — Other Ambulatory Visit (HOSPITAL_BASED_OUTPATIENT_CLINIC_OR_DEPARTMENT_OTHER): Payer: Self-pay

## 2022-09-20 ENCOUNTER — Ambulatory Visit (INDEPENDENT_AMBULATORY_CARE_PROVIDER_SITE_OTHER): Payer: BC Managed Care – PPO | Admitting: Obstetrics & Gynecology

## 2022-09-20 ENCOUNTER — Encounter (HOSPITAL_BASED_OUTPATIENT_CLINIC_OR_DEPARTMENT_OTHER): Payer: Self-pay | Admitting: Obstetrics & Gynecology

## 2022-09-20 VITALS — BP 106/79 | HR 68 | Ht 68.5 in | Wt 123.8 lb

## 2022-09-20 DIAGNOSIS — F419 Anxiety disorder, unspecified: Secondary | ICD-10-CM

## 2022-09-20 DIAGNOSIS — Z780 Asymptomatic menopausal state: Secondary | ICD-10-CM

## 2022-09-20 DIAGNOSIS — Z01419 Encounter for gynecological examination (general) (routine) without abnormal findings: Secondary | ICD-10-CM | POA: Diagnosis not present

## 2022-09-20 DIAGNOSIS — G43009 Migraine without aura, not intractable, without status migrainosus: Secondary | ICD-10-CM | POA: Diagnosis not present

## 2022-09-20 MED ORDER — INFLUENZA VAC SPLIT QUAD 0.5 ML IM SUSY
PREFILLED_SYRINGE | INTRAMUSCULAR | 0 refills | Status: DC
  Filled 2022-09-20: qty 0.5, 1d supply, fill #0

## 2022-09-20 MED ORDER — SERTRALINE HCL 100 MG PO TABS: 50.0000 mg | ORAL_TABLET | Freq: Every day | ORAL | 3 refills | Status: DC

## 2022-09-20 MED ORDER — SUMATRIPTAN SUCCINATE 100 MG PO TABS: ORAL_TABLET | ORAL | 4 refills | Status: DC

## 2022-09-20 MED ORDER — COVID-19 MRNA VAC-TRIS(PFIZER) 30 MCG/0.3ML IM SUSY
0.3000 mL | PREFILLED_SYRINGE | Freq: Once | INTRAMUSCULAR | 0 refills | Status: AC
  Filled 2022-09-20: qty 0.3, 1d supply, fill #0

## 2022-09-20 NOTE — Progress Notes (Signed)
56 y.o. G45P2002 Married White or Caucasian female here for annual exam.  Doing well.  Denies vaginal bleeding.    No LMP recorded. (Menstrual status: Perimenopausal).          Sexually active: Yes.    The current method of family planning is post menopausal status.     The pregnancy intention screening data noted above was reviewed. Potential methods of contraception were discussed. The patient elected to proceed with No data recorded.  Exercising: Yes.     You tube pilates Smoker:  no  Health Maintenance: Pap:  08/22/2020 Negative History of abnormal Pap:  remote hx MMG:  05/20/2022 Negative Colonoscopy:  08/29/2021, follow up 10 years BMD:   age 31 Screening Labs: not sure if will need for health insurance this year   reports that she has never smoked. She has never used smokeless tobacco. She reports current alcohol use of about 7.0 standard drinks of alcohol per week. She reports that she does not use drugs.  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   Infertility, female    took clomid   Migraines    w/o aura   SCC (squamous cell carcinoma) 2007   right leg   Situational anxiety     Past Surgical History:  Procedure Laterality Date   AUGMENTATION MAMMAPLASTY Bilateral    times 2   BREAST ENHANCEMENT SURGERY Bilateral 01/2007   implants   BREAST ENHANCEMENT SURGERY Bilateral 03/2008   reconstruction/replacement breast implants   CESAREAN SECTION  01/1997   FINGER SURGERY     FOOT SURGERY Bilateral 2015   2015, 2019   NEUROMA SURGERY Right 12/2014    Current Outpatient Medications  Medication Sig Dispense Refill   acetaminophen (TYLENOL) 325 MG tablet Take 650 mg by mouth every 6 (six) hours as needed.     Multiple Vitamin (MULTIVITAMIN) capsule Take 1 capsule by mouth daily.     sertraline (ZOLOFT) 100 MG tablet Take 0.5 tablets (50 mg total) by mouth daily. 90 tablet 3   SUMAtriptan  (IMITREX) 100 MG tablet AS DIRECTED FOR HEADACHES WITH 1 TABLET AT ONSET AND REPEAT IN 2 HOURS FOR A MAX DOSE OF 2 TABLETS IN 24 HOURS 27 tablet 4   No current facility-administered medications for this visit.    Family History  Problem Relation Age of Onset   Osteoarthritis Mother    Colon polyps Father    Heart attack Father        CABG   Transient ischemic attack Father    Melanoma Father    Colon polyps Brother    Breast cancer Maternal Grandmother 29   Ovarian cancer Maternal Grandmother 46   Hypertension Maternal Grandfather    Melanoma Maternal Grandfather    Stroke Paternal Grandmother    Osteoporosis Paternal Grandmother    Melanoma Paternal Grandfather    Hypertension Paternal Grandfather    Melanoma Maternal Aunt    Rheum arthritis Maternal Aunt     ROS: Constitutional: negative Genitourinary:negative  Exam:   BP 106/79 (BP Location: Right Arm, Patient Position: Sitting, Cuff Size: Normal)   Pulse 68   Ht 5' 8.5" (1.74 m) Comment: Reported  Wt 123 lb 12.8 oz (56.2 kg)   BMI 18.55 kg/m   Height: 5' 8.5" (174 cm) (Reported)  General appearance: alert, cooperative and appears stated age Head: Normocephalic, without obvious abnormality, atraumatic Neck: no adenopathy, supple,  symmetrical, trachea midline and thyroid normal to inspection and palpation Lungs: clear to auscultation bilaterally Breasts: normal appearance, no masses or tenderness Heart: regular rate and rhythm Abdomen: soft, non-tender; bowel sounds normal; no masses,  no organomegaly Extremities: extremities normal, atraumatic, no cyanosis or edema Skin: Skin color, texture, turgor normal. No rashes or lesions Lymph nodes: Cervical, supraclavicular, and axillary nodes normal. No abnormal inguinal nodes palpated Neurologic: Grossly normal   Pelvic: External genitalia:  no lesions              Urethra:  normal appearing urethra with no masses, tenderness or lesions              Bartholins and  Skenes: normal                 Vagina: normal appearing vagina with normal color and no discharge, no lesions              Cervix: no lesions              Pap taken: No. Bimanual Exam:  Uterus:  normal size, contour, position, consistency, mobility, non-tender              Adnexa: normal adnexa and no mass, fullness, tenderness               Rectovaginal: Confirms               Anus:  normal sphincter tone, no lesions  Chaperone, Octaviano Batty, CMA, was present for exam.  Assessment/Plan: 1. Well woman exam with routine gynecological exam - Pap smear 2021.  Not indicated today. - Mammogram 04/2022 - Colonoscopy 2022 - Bone mineral density will be planned around age 13 - lab work not done today.  Pt will let me know if needs  - vaccines reviewed/updated.  Will have flu and covid update done today.  2. Postmenopausal - no HRT  3. Migraine without aura and without status migrainosus, not intractable - SUMAtriptan (IMITREX) 100 MG tablet; AS DIRECTED FOR HEADACHES WITH 1 TABLET AT ONSET AND REPEAT IN 2 HOURS FOR A MAX DOSE OF 2 TABLETS IN 24 HOURS  Dispense: 27 tablet; Refill: 4  4. Anxiety - sertraline (ZOLOFT) 100 MG tablet; Take 0.5 tablets (50 mg total) by mouth daily.  Dispense: 90 tablet; Refill: 3

## 2022-09-23 ENCOUNTER — Ambulatory Visit (HOSPITAL_BASED_OUTPATIENT_CLINIC_OR_DEPARTMENT_OTHER): Payer: BC Managed Care – PPO | Admitting: Obstetrics & Gynecology

## 2022-10-04 DIAGNOSIS — M79644 Pain in right finger(s): Secondary | ICD-10-CM | POA: Diagnosis not present

## 2022-10-14 ENCOUNTER — Telehealth: Payer: Self-pay | Admitting: Family Medicine

## 2022-10-14 NOTE — Telephone Encounter (Signed)
Is it ok for pt to come get her tetanus shot now?

## 2022-10-14 NOTE — Telephone Encounter (Signed)
Last was 10/2012. She can schedule for nurse visit (I don't think insurance makes you wait until 10 years exactly, she can always verify if she wants)

## 2022-10-14 NOTE — Telephone Encounter (Signed)
Pt informed. She will call her insurance before scheduling to be on the safe side.

## 2022-10-28 DIAGNOSIS — M19041 Primary osteoarthritis, right hand: Secondary | ICD-10-CM | POA: Diagnosis not present

## 2022-11-05 DIAGNOSIS — M25641 Stiffness of right hand, not elsewhere classified: Secondary | ICD-10-CM | POA: Diagnosis not present

## 2022-11-05 DIAGNOSIS — M19041 Primary osteoarthritis, right hand: Secondary | ICD-10-CM | POA: Diagnosis not present

## 2022-12-10 DIAGNOSIS — M79644 Pain in right finger(s): Secondary | ICD-10-CM | POA: Diagnosis not present

## 2023-01-07 DIAGNOSIS — M79644 Pain in right finger(s): Secondary | ICD-10-CM | POA: Diagnosis not present

## 2023-01-21 DIAGNOSIS — M19041 Primary osteoarthritis, right hand: Secondary | ICD-10-CM | POA: Diagnosis not present

## 2023-02-12 ENCOUNTER — Other Ambulatory Visit (HOSPITAL_COMMUNITY): Payer: Self-pay

## 2023-02-18 DIAGNOSIS — M19041 Primary osteoarthritis, right hand: Secondary | ICD-10-CM | POA: Diagnosis not present

## 2023-04-01 DIAGNOSIS — D2262 Melanocytic nevi of left upper limb, including shoulder: Secondary | ICD-10-CM | POA: Diagnosis not present

## 2023-04-01 DIAGNOSIS — Z85828 Personal history of other malignant neoplasm of skin: Secondary | ICD-10-CM | POA: Diagnosis not present

## 2023-04-01 DIAGNOSIS — L738 Other specified follicular disorders: Secondary | ICD-10-CM | POA: Diagnosis not present

## 2023-04-01 DIAGNOSIS — L570 Actinic keratosis: Secondary | ICD-10-CM | POA: Diagnosis not present

## 2023-04-10 ENCOUNTER — Other Ambulatory Visit: Payer: Self-pay | Admitting: Obstetrics & Gynecology

## 2023-04-10 DIAGNOSIS — Z0000 Encounter for general adult medical examination without abnormal findings: Secondary | ICD-10-CM

## 2023-04-16 ENCOUNTER — Encounter: Payer: BC Managed Care – PPO | Admitting: Family Medicine

## 2023-06-02 ENCOUNTER — Ambulatory Visit: Payer: BC Managed Care – PPO

## 2023-06-02 ENCOUNTER — Ambulatory Visit
Admission: RE | Admit: 2023-06-02 | Discharge: 2023-06-02 | Disposition: A | Discharge status: Home / Self Care | Payer: BC Managed Care – PPO | Source: Ambulatory Visit | Attending: Obstetrics & Gynecology | Admitting: Obstetrics & Gynecology

## 2023-06-02 DIAGNOSIS — Z0000 Encounter for general adult medical examination without abnormal findings: Secondary | ICD-10-CM

## 2023-06-02 DIAGNOSIS — Z1231 Encounter for screening mammogram for malignant neoplasm of breast: Secondary | ICD-10-CM | POA: Diagnosis not present

## 2023-09-22 ENCOUNTER — Ambulatory Visit (HOSPITAL_BASED_OUTPATIENT_CLINIC_OR_DEPARTMENT_OTHER): Payer: BC Managed Care – PPO | Admitting: Obstetrics & Gynecology

## 2023-09-22 ENCOUNTER — Other Ambulatory Visit (HOSPITAL_COMMUNITY)
Admission: RE | Admit: 2023-09-22 | Discharge: 2023-09-22 | Disposition: A | Discharge status: Home / Self Care | Payer: BC Managed Care – PPO | Source: Ambulatory Visit | Attending: Obstetrics & Gynecology | Admitting: Obstetrics & Gynecology

## 2023-09-22 ENCOUNTER — Encounter (HOSPITAL_BASED_OUTPATIENT_CLINIC_OR_DEPARTMENT_OTHER): Payer: Self-pay | Admitting: Obstetrics & Gynecology

## 2023-09-22 VITALS — BP 107/70 | HR 70 | Ht 68.25 in | Wt 122.4 lb

## 2023-09-22 DIAGNOSIS — Z860100 Personal history of colon polyps, unspecified: Secondary | ICD-10-CM | POA: Diagnosis not present

## 2023-09-22 DIAGNOSIS — E7800 Pure hypercholesterolemia, unspecified: Secondary | ICD-10-CM

## 2023-09-22 DIAGNOSIS — F419 Anxiety disorder, unspecified: Secondary | ICD-10-CM

## 2023-09-22 DIAGNOSIS — Z01419 Encounter for gynecological examination (general) (routine) without abnormal findings: Secondary | ICD-10-CM | POA: Diagnosis not present

## 2023-09-22 DIAGNOSIS — Z13820 Encounter for screening for osteoporosis: Secondary | ICD-10-CM

## 2023-09-22 DIAGNOSIS — G43009 Migraine without aura, not intractable, without status migrainosus: Secondary | ICD-10-CM

## 2023-09-22 DIAGNOSIS — Z124 Encounter for screening for malignant neoplasm of cervix: Secondary | ICD-10-CM | POA: Insufficient documentation

## 2023-09-22 DIAGNOSIS — Z23 Encounter for immunization: Secondary | ICD-10-CM

## 2023-09-22 MED ORDER — SUMATRIPTAN SUCCINATE 100 MG PO TABS
ORAL_TABLET | ORAL | 4 refills | Status: AC
Start: 2023-09-22 — End: ?

## 2023-09-22 MED ORDER — SERTRALINE HCL 100 MG PO TABS: 50.0000 mg | ORAL_TABLET | Freq: Every day | ORAL | 3 refills | Status: DC

## 2023-09-22 NOTE — Patient Instructions (Signed)
Non hormonal:   Coconut oil twice weekly Replens vaginal moisturizer twice weekly Revaree - vaginal hyaluronic acid suppository twice weekly Vit E vaginal suppositories    Hormonal: Estradiol cream -- twice weekly (prescription) Estradiol vaginal tablet - twice weekly (prescription)

## 2023-09-22 NOTE — Progress Notes (Signed)
57 y.o. G71P2002 Married White or Caucasian female here for annual exam.  Having a lot of stressors.  Headaches are better but does need RF for imitrex.  Would like to plan having a bone density test done this year.    Denies vaginal bleeding.    No LMP recorded. (Menstrual status: Perimenopausal).          Sexually active: Yes.    The current method of family planning is post menopausal status.    Exercising: Yes.     Yoga and pilates Smoker:  no  Health Maintenance: Pap:  2021 History of abnormal Pap:  remote hx MMG:  05/2023 Colonoscopy:  08/2021 BMD:   ordered to do this next year Screening Labs:last done 08/2022   reports that she has never smoked. She has never used smokeless tobacco. She reports current alcohol use of about 7.0 standard drinks of alcohol per week. She reports that she does not use drugs.  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   Infertility, female    took clomid   Migraines    w/o aura   SCC (squamous cell carcinoma) 2007   right leg   Situational anxiety     Past Surgical History:  Procedure Laterality Date   AUGMENTATION MAMMAPLASTY Bilateral    times 2   BREAST ENHANCEMENT SURGERY Bilateral 01/2007   implants   BREAST ENHANCEMENT SURGERY Bilateral 03/2008   reconstruction/replacement breast implants   CESAREAN SECTION  01/1997   FINGER SURGERY     FOOT SURGERY Bilateral 2015   2015, 2019   NEUROMA SURGERY Right 12/2014    Current Outpatient Medications  Medication Sig Dispense Refill   acetaminophen (TYLENOL) 325 MG tablet Take 650 mg by mouth every 6 (six) hours as needed.     Multiple Vitamin (MULTIVITAMIN) capsule Take 1 capsule by mouth daily.     sertraline (ZOLOFT) 100 MG tablet Take 0.5 tablets (50 mg total) by mouth daily. 90 tablet 3   SUMAtriptan (IMITREX) 100 MG tablet AS DIRECTED FOR HEADACHES WITH 1 TABLET AT ONSET AND REPEAT IN 2 HOURS FOR A MAX DOSE  OF 2 TABLETS IN 24 HOURS 27 tablet 4   No current facility-administered medications for this visit.    Family History  Problem Relation Age of Onset   Osteoarthritis Mother    Colon polyps Father    Heart attack Father        CABG   Transient ischemic attack Father    Melanoma Father    Colon polyps Brother    Breast cancer Maternal Grandmother 11   Ovarian cancer Maternal Grandmother 54   Hypertension Maternal Grandfather    Melanoma Maternal Grandfather    Stroke Paternal Grandmother    Osteoporosis Paternal Grandmother    Melanoma Paternal Grandfather    Hypertension Paternal Grandfather    Melanoma Maternal Aunt    Rheum arthritis Maternal Aunt     ROS: Constitutional: negative Genitourinary:negative  Exam:   BP 107/70 (BP Location: Right Arm, Patient Position: Sitting, Cuff Size: Normal)   Pulse 70   Ht 5' 8.25" (1.734 m)   Wt 122 lb 6.4 oz (55.5 kg)   BMI 18.47 kg/m   Height: 5' 8.25" (173.4 cm)  General appearance: alert, cooperative and appears stated age Head: Normocephalic, without obvious abnormality, atraumatic Neck: no adenopathy, supple, symmetrical, trachea midline and thyroid normal to inspection and palpation  Lungs: normal respirations Breasts: normal appearance, no masses or tenderness Heart: regular rate Abdomen: soft, non-tender; bowel sounds normal; no masses,  no organomegaly Extremities: extremities normal, atraumatic, no cyanosis or edema Skin: Skin color, texture, turgor normal. No rashes or lesions Lymph nodes: Cervical, supraclavicular, and axillary nodes normal. No abnormal inguinal nodes palpated Neurologic: Grossly normal   Pelvic: External genitalia:  no lesions              Urethra:  normal appearing urethra with no masses, tenderness or lesions              Bartholins and Skenes: normal                 Vagina: normal appearing vagina with normal color and no discharge, no lesions              Cervix: no lesions              Pap  taken: Yes.   Bimanual Exam:  Uterus:  normal size, contour, position, consistency, mobility, non-tender              Adnexa: normal adnexa and no mass, fullness, tenderness               Rectovaginal: Confirms               Anus:  normal sphincter tone, no lesions  Chaperone, Ina Homes, CMA, was present for exam.  Assessment/Plan: 1. Well woman exam with routine gynecological exam - Pap smear obtained with today HR HPV  - Mammogram 05/2023 - Colonoscopy 08/2021, follow up 10 years - Bone mineral density ordered - lab work ordered today:  CMP. TSH, Lipids, hbA1C - vaccines reviewed/updated  2. Need for influenza vaccination - Flu vaccine trivalent PF, 6mos and older(Flulaval,Afluria,Fluarix,Fluzone)  3. History of colon polyps  4. Migraine without aura and without status migrainosus, not intractable - SUMAtriptan (IMITREX) 100 MG tablet; AS DIRECTED FOR HEADACHES WITH 1 TABLET AT ONSET AND REPEAT IN 2 HOURS FOR A MAX DOSE OF 2 TABLETS IN 24 HOURS  Dispense: 27 tablet; Refill: 4  5. Anxiety - sertraline (ZOLOFT) 100 MG tablet; Take 0.5 tablets (50 mg total) by mouth daily.  Dispense: 90 tablet; Refill: 3  6. Osteoporosis screening - DG BONE DENSITY (DXA); Future  7. Cervical cancer screening - Cytology - PAP( [employer])

## 2023-09-23 LAB — COMPREHENSIVE METABOLIC PANEL
ALT: 12 [IU]/L (ref 0–32)
AST: 18 [IU]/L (ref 0–40)
Albumin: 4.5 g/dL (ref 3.8–4.9)
Alkaline Phosphatase: 53 [IU]/L (ref 44–121)
BUN/Creatinine Ratio: 19 (ref 9–23)
BUN: 14 mg/dL (ref 6–24)
Bilirubin Total: 0.6 mg/dL (ref 0.0–1.2)
CO2: 23 mmol/L (ref 20–29)
Calcium: 9.6 mg/dL (ref 8.7–10.2)
Chloride: 102 mmol/L (ref 96–106)
Creatinine, Ser: 0.75 mg/dL (ref 0.57–1.00)
Globulin, Total: 2.4 g/dL (ref 1.5–4.5)
Glucose: 97 mg/dL (ref 70–99)
Potassium: 4.7 mmol/L (ref 3.5–5.2)
Sodium: 140 mmol/L (ref 134–144)
Total Protein: 6.9 g/dL (ref 6.0–8.5)
eGFR: 92 mL/min/{1.73_m2} (ref >59)

## 2023-09-23 LAB — TSH: TSH: 0.896 u[IU]/mL (ref 0.450–4.500)

## 2023-09-23 LAB — LIPID PANEL
Chol/HDL Ratio: 2.5 ratio (ref 0.0–4.4)
Cholesterol, Total: 205 mg/dL — ABNORMAL HIGH (ref 100–199)
HDL: 82 mg/dL (ref >39)
LDL Chol Calc (NIH): 111 mg/dL — ABNORMAL HIGH (ref 0–99)
Triglycerides: 67 mg/dL (ref 0–149)
VLDL Cholesterol Cal: 12 mg/dL (ref 5–40)

## 2023-09-23 LAB — HEMOGLOBIN A1C
Est. average glucose Bld gHb Est-mCnc: 117 mg/dL
Hgb A1c MFr Bld: 5.7 % — ABNORMAL HIGH (ref 4.8–5.6)

## 2023-09-25 LAB — CYTOLOGY - PAP
Comment: NEGATIVE
Diagnosis: NEGATIVE
High risk HPV: NEGATIVE

## 2023-12-10 ENCOUNTER — Other Ambulatory Visit (HOSPITAL_BASED_OUTPATIENT_CLINIC_OR_DEPARTMENT_OTHER): Payer: Self-pay | Admitting: Obstetrics & Gynecology

## 2023-12-10 DIAGNOSIS — Z0000 Encounter for general adult medical examination without abnormal findings: Secondary | ICD-10-CM

## 2023-12-14 ENCOUNTER — Other Ambulatory Visit (HOSPITAL_BASED_OUTPATIENT_CLINIC_OR_DEPARTMENT_OTHER): Payer: Self-pay | Admitting: Obstetrics & Gynecology

## 2023-12-14 DIAGNOSIS — F419 Anxiety disorder, unspecified: Secondary | ICD-10-CM

## 2023-12-15 ENCOUNTER — Ambulatory Visit
Admission: RE | Admit: 2023-12-15 | Discharge: 2023-12-15 | Disposition: A | Discharge status: Home / Self Care | Payer: BC Managed Care – PPO | Source: Ambulatory Visit | Attending: Obstetrics & Gynecology | Admitting: Obstetrics & Gynecology

## 2023-12-15 DIAGNOSIS — M8588 Other specified disorders of bone density and structure, other site: Secondary | ICD-10-CM | POA: Diagnosis not present

## 2023-12-15 DIAGNOSIS — E2839 Other primary ovarian failure: Secondary | ICD-10-CM | POA: Diagnosis not present

## 2023-12-15 DIAGNOSIS — Z13820 Encounter for screening for osteoporosis: Secondary | ICD-10-CM

## 2023-12-15 DIAGNOSIS — N958 Other specified menopausal and perimenopausal disorders: Secondary | ICD-10-CM | POA: Diagnosis not present

## 2023-12-16 ENCOUNTER — Encounter (HOSPITAL_BASED_OUTPATIENT_CLINIC_OR_DEPARTMENT_OTHER): Payer: Self-pay | Admitting: Obstetrics & Gynecology

## 2023-12-30 NOTE — Progress Notes (Signed)
PUO

## 2024-04-27 DIAGNOSIS — L814 Other melanin hyperpigmentation: Secondary | ICD-10-CM | POA: Diagnosis not present

## 2024-04-27 DIAGNOSIS — Z85828 Personal history of other malignant neoplasm of skin: Secondary | ICD-10-CM | POA: Diagnosis not present

## 2024-04-27 DIAGNOSIS — D485 Neoplasm of uncertain behavior of skin: Secondary | ICD-10-CM | POA: Diagnosis not present

## 2024-04-27 DIAGNOSIS — L570 Actinic keratosis: Secondary | ICD-10-CM | POA: Diagnosis not present

## 2024-04-27 DIAGNOSIS — D2272 Melanocytic nevi of left lower limb, including hip: Secondary | ICD-10-CM | POA: Diagnosis not present

## 2024-04-27 DIAGNOSIS — D1801 Hemangioma of skin and subcutaneous tissue: Secondary | ICD-10-CM | POA: Diagnosis not present

## 2024-04-27 DIAGNOSIS — L738 Other specified follicular disorders: Secondary | ICD-10-CM | POA: Diagnosis not present

## 2024-04-27 DIAGNOSIS — L986 Other infiltrative disorders of the skin and subcutaneous tissue: Secondary | ICD-10-CM | POA: Diagnosis not present

## 2024-04-27 DIAGNOSIS — D1809 Hemangioma of other sites: Secondary | ICD-10-CM | POA: Diagnosis not present

## 2024-04-27 DIAGNOSIS — L821 Other seborrheic keratosis: Secondary | ICD-10-CM | POA: Diagnosis not present

## 2024-06-02 ENCOUNTER — Ambulatory Visit
Admission: RE | Admit: 2024-06-02 | Discharge: 2024-06-02 | Disposition: A | Discharge status: Home / Self Care | Payer: BC Managed Care – PPO | Source: Ambulatory Visit | Attending: Obstetrics & Gynecology | Admitting: Obstetrics & Gynecology

## 2024-06-02 DIAGNOSIS — Z0000 Encounter for general adult medical examination without abnormal findings: Secondary | ICD-10-CM

## 2024-06-03 ENCOUNTER — Other Ambulatory Visit: Payer: Self-pay | Admitting: Medical Genetics

## 2024-06-08 ENCOUNTER — Other Ambulatory Visit: Payer: Self-pay | Admitting: Obstetrics & Gynecology

## 2024-06-08 ENCOUNTER — Ambulatory Visit (HOSPITAL_BASED_OUTPATIENT_CLINIC_OR_DEPARTMENT_OTHER): Payer: Self-pay | Admitting: Obstetrics & Gynecology

## 2024-06-08 DIAGNOSIS — R928 Other abnormal and inconclusive findings on diagnostic imaging of breast: Secondary | ICD-10-CM

## 2024-06-09 ENCOUNTER — Other Ambulatory Visit: Payer: Self-pay

## 2024-06-09 DIAGNOSIS — Z006 Encounter for examination for normal comparison and control in clinical research program: Secondary | ICD-10-CM

## 2024-06-16 ENCOUNTER — Ambulatory Visit
Admission: RE | Admit: 2024-06-16 | Discharge: 2024-06-16 | Disposition: A | Discharge status: Home / Self Care | Source: Ambulatory Visit | Attending: Obstetrics & Gynecology | Admitting: Obstetrics & Gynecology

## 2024-06-16 ENCOUNTER — Ambulatory Visit

## 2024-06-16 DIAGNOSIS — R928 Other abnormal and inconclusive findings on diagnostic imaging of breast: Secondary | ICD-10-CM | POA: Diagnosis not present

## 2024-06-21 LAB — GENECONNECT MOLECULAR SCREEN: Genetic Analysis Overall Interpretation: NEGATIVE

## 2024-06-25 ENCOUNTER — Other Ambulatory Visit (HOSPITAL_BASED_OUTPATIENT_CLINIC_OR_DEPARTMENT_OTHER): Payer: Self-pay | Admitting: Obstetrics & Gynecology

## 2024-06-25 ENCOUNTER — Ambulatory Visit (HOSPITAL_BASED_OUTPATIENT_CLINIC_OR_DEPARTMENT_OTHER): Payer: Self-pay | Admitting: Obstetrics & Gynecology

## 2024-06-25 DIAGNOSIS — F419 Anxiety disorder, unspecified: Secondary | ICD-10-CM

## 2024-07-14 DIAGNOSIS — H40033 Anatomical narrow angle, bilateral: Secondary | ICD-10-CM | POA: Diagnosis not present

## 2024-07-14 DIAGNOSIS — D3131 Benign neoplasm of right choroid: Secondary | ICD-10-CM | POA: Diagnosis not present

## 2024-07-14 DIAGNOSIS — H25813 Combined forms of age-related cataract, bilateral: Secondary | ICD-10-CM | POA: Diagnosis not present

## 2024-08-24 ENCOUNTER — Encounter (HOSPITAL_BASED_OUTPATIENT_CLINIC_OR_DEPARTMENT_OTHER): Payer: Self-pay | Admitting: Obstetrics & Gynecology

## 2024-12-31 ENCOUNTER — Ambulatory Visit (HOSPITAL_BASED_OUTPATIENT_CLINIC_OR_DEPARTMENT_OTHER): Admitting: Obstetrics & Gynecology

## 2025-01-31 ENCOUNTER — Ambulatory Visit (HOSPITAL_BASED_OUTPATIENT_CLINIC_OR_DEPARTMENT_OTHER): Admitting: Obstetrics & Gynecology
